# Patient Record
Sex: Male | Born: 1965 | Race: White | Hispanic: No | Smoking: Current every day smoker
Health system: Southern US, Community
[De-identification: ages and names within clinical notes are randomized; demographics above are authoritative.]

## PROBLEM LIST (undated history)

## (undated) DIAGNOSIS — M51369 Other intervertebral disc degeneration, lumbar region without mention of lumbar back pain or lower extremity pain: Secondary | ICD-10-CM

## (undated) DIAGNOSIS — K219 Gastro-esophageal reflux disease without esophagitis: Secondary | ICD-10-CM

## (undated) DIAGNOSIS — K649 Unspecified hemorrhoids: Secondary | ICD-10-CM

## (undated) DIAGNOSIS — I10 Essential (primary) hypertension: Secondary | ICD-10-CM

## (undated) DIAGNOSIS — I519 Heart disease, unspecified: Secondary | ICD-10-CM

## (undated) DIAGNOSIS — E785 Hyperlipidemia, unspecified: Secondary | ICD-10-CM

## (undated) DIAGNOSIS — N433 Hydrocele, unspecified: Secondary | ICD-10-CM

## (undated) DIAGNOSIS — M5136 Other intervertebral disc degeneration, lumbar region: Secondary | ICD-10-CM

## (undated) DIAGNOSIS — M1990 Unspecified osteoarthritis, unspecified site: Secondary | ICD-10-CM

## (undated) DIAGNOSIS — E119 Type 2 diabetes mellitus without complications: Secondary | ICD-10-CM

## (undated) DIAGNOSIS — G4730 Sleep apnea, unspecified: Secondary | ICD-10-CM

## (undated) DIAGNOSIS — R12 Heartburn: Secondary | ICD-10-CM

## (undated) HISTORY — DX: Unspecified hemorrhoids: K64.9

## (undated) HISTORY — DX: Type 2 diabetes mellitus without complications: E11.9

## (undated) HISTORY — DX: Sleep apnea, unspecified: G47.30

## (undated) HISTORY — DX: Heart disease, unspecified: I51.9

## (undated) HISTORY — DX: Hydrocele, unspecified: N43.3

## (undated) HISTORY — DX: Other intervertebral disc degeneration, lumbar region: M51.36

## (undated) HISTORY — DX: Heartburn: R12

## (undated) HISTORY — DX: Other intervertebral disc degeneration, lumbar region without mention of lumbar back pain or lower extremity pain: M51.369

## (undated) HISTORY — DX: Unspecified osteoarthritis, unspecified site: M19.90

## (undated) HISTORY — DX: Essential (primary) hypertension: I10

## (undated) HISTORY — DX: Gastro-esophageal reflux disease without esophagitis: K21.9

## (undated) HISTORY — DX: Hyperlipidemia, unspecified: E78.5

---

## 1990-03-27 HISTORY — PX: COLONOSCOPY: SHX174

## 1991-03-28 HISTORY — PX: POLYPECTOMY: SHX149

## 2002-03-27 HISTORY — PX: ROTATOR CUFF REPAIR: SHX139

## 2003-03-28 HISTORY — PX: CORONARY ANGIOPLASTY WITH STENT PLACEMENT: SHX49

## 2004-03-27 HISTORY — PX: CARPAL TUNNEL RELEASE: SHX101

## 2012-08-27 DIAGNOSIS — R809 Proteinuria, unspecified: Secondary | ICD-10-CM | POA: Insufficient documentation

## 2012-09-18 DIAGNOSIS — Z79899 Other long term (current) drug therapy: Secondary | ICD-10-CM | POA: Insufficient documentation

## 2015-06-01 ENCOUNTER — Encounter: Payer: Self-pay | Admitting: Urology

## 2015-06-01 ENCOUNTER — Ambulatory Visit (INDEPENDENT_AMBULATORY_CARE_PROVIDER_SITE_OTHER): Admitting: Urology

## 2015-06-01 VITALS — BP 164/94 | HR 79 | Ht 72.0 in | Wt 344.2 lb

## 2015-06-01 DIAGNOSIS — N432 Other hydrocele: Secondary | ICD-10-CM

## 2015-06-01 NOTE — Progress Notes (Signed)
06/01/2015 2:27 PM   Earl Middleton. [birth year] [id number]  Referring provider: No referring provider defined for this encounter.  Chief Complaint  Patient presents with  . Hydrocele    referred by Dr. Melina Copa     HPI:  50 year old male presents today for evaluation and management of a hydrocele. The patient first noted swelling in his scrotum following a racquetball incident while in the [employer] several years ago. He denies any significant pain currently but does note that over the last 5 years his scrotum has gotten progressively larger. He was seen at [facility] 2 years ago and recommended the patient have surgery. Since that time the patient has noted that the scrotum is gotten even progressively larger. He only has pain and discomfort when the scrotum was manipulated/sat down. He also has had difficulty with intercourse given the large scrotum  Interference.   The patient has chronic pain takes a significant amount of pain medication for this. He is also an insulin-dependent diabetic. His blood glucose levels have recently improved.     PMH: Past Medical History  Diagnosis Date  . Heartburn   . Hydrocele   . Diabetes ([place])   . Heart disease   . HLD (hyperlipidemia)   . HTN (hypertension)   . Sleep apnea     Surgical History: Past Surgical History  Procedure Laterality Date  . Coronary angioplasty with stent placement  2005  . Carpal tunnel release  2006    Home Medications:    Medication List       This list is accurate as of: 06/01/15  2:27 PM.  Always use your most recent med list.               amoxicillin 500 MG capsule  Commonly known as:  AMOXIL  Reported on 06/01/2015     aspirin 81 MG chewable tablet  Chew 81 mg by mouth.     atenolol 100 MG tablet  Commonly known as:  TENORMIN  Take 100 mg by mouth. Reported on 06/01/2015     atorvastatin 80 MG tablet  Commonly known as:  LIPITOR  Take 80 mg by mouth.     Azilsartan-Chlorthalidone 40-25  MG Tabs  Take by mouth. Reported on 06/01/2015     buPROPion 75 MG tablet  Commonly known as:  WELLBUTRIN  Take 75 mg by mouth.     BUTRANS 10 MCG/HR Ptwk patch  Generic drug:  buprenorphine  Place onto the skin.     BYSTOLIC 20 MG Tabs  Generic drug:  Nebivolol HCl  Take 20 mg by mouth.     clopidogrel 75 MG tablet  Commonly known as:  PLAVIX  Take 75 mg by mouth.     escitalopram 20 MG tablet  Commonly known as:  LEXAPRO  Take 20 mg by mouth.     fenofibrate 160 MG tablet  Take 160 mg by mouth. Reported on 06/01/2015     furosemide 20 MG tablet  Commonly known as:  LASIX  Take 20 mg by mouth.     glipiZIDE 5 MG 24 hr tablet  Commonly known as:  GLUCOTROL XL  Take 5 mg by mouth. Reported on 06/01/2015     hydrALAZINE 50 MG tablet  Commonly known as:  APRESOLINE  Take 50 mg by mouth.     ibuprofen 800 MG tablet  Commonly known as:  ADVIL,MOTRIN  Reported on 06/01/2015     insulin glargine 100 UNIT/ML injection  Commonly known  as:  LANTUS  Inject into the skin at bedtime.     lidocaine 5 % ointment  Commonly known as:  XYLOCAINE  Reported on 06/01/2015     LYRICA 100 MG capsule  Generic drug:  pregabalin  Take 100 mg by mouth.     morphine 15 MG 12 hr tablet  Commonly known as:  MS CONTIN  Take 15 mg by mouth.     MULTI-VITAMINS Tabs  Take by mouth.     NIFEdipine 90 MG 24 hr tablet  Commonly known as:  ADALAT CC  Take 90 mg by mouth.     Omega-3 1000 MG Caps  Take by mouth.     omeprazole 20 MG capsule  Commonly known as:  PRILOSEC  Take 20 mg by mouth. Reported on 06/01/2015     potassium chloride 20 MEQ packet  Commonly known as:  KLOR-CON  Take by mouth.     ROBAXIN-750 750 MG tablet  Generic drug:  methocarbamol  Take 750 mg by mouth.     spironolactone 25 MG tablet  Commonly known as:  ALDACTONE     TRADJENTA 5 MG Tabs tablet  Generic drug:  linagliptin  Take 5 mg by mouth.     valsartan 320 MG tablet  Commonly known as:  DIOVAN  Take  320 mg by mouth.     VICTOZA 18 MG/3ML Sopn  Generic drug:  Liraglutide  Inject 1.8 mg into the skin.     vitamin B-12 500 MCG tablet  Commonly known as:  CYANOCOBALAMIN  Take by mouth. Reported on 06/01/2015     Vitamin D (Ergocalciferol) 50000 units Caps capsule  Commonly known as:  DRISDOL  Take by mouth.        Allergies: No Known Allergies  Family History: Family History  Problem Relation Age of Onset  . Kidney disease Father   . Prostate cancer Neg Hx     Social History:  reports that he has been smoking.  He does not have any smokeless tobacco history on file. He reports that he drinks alcohol. He reports that he does not use illicit drugs.  ROS: UROLOGY Frequent Urination?: Yes Hard to postpone urination?: No Burning/pain with urination?: No Get up at night to urinate?: Yes Leakage of urine?: No Urine stream starts and stops?: No Trouble starting stream?: No Do you have to strain to urinate?: No Blood in urine?: No Urinary tract infection?: No Sexually transmitted disease?: No Injury to kidneys or bladder?: No Painful intercourse?: No Weak stream?: No Erection problems?: No Penile pain?: No  Gastrointestinal Nausea?: No Vomiting?: No Indigestion/heartburn?: No Diarrhea?: No Constipation?: No  Constitutional Fever: No Night sweats?: No Weight loss?: No Fatigue?: Yes  Skin Skin rash/lesions?: No Itching?: No  Eyes Blurred vision?: No Double vision?: No  Ears/Nose/Throat Sore throat?: No Sinus problems?: No  Hematologic/Lymphatic Swollen glands?: No Easy bruising?: No  Cardiovascular Leg swelling?: Yes Chest pain?: No  Respiratory Cough?: No Shortness of breath?: No  Endocrine Excessive thirst?: Yes  Musculoskeletal Back pain?: Yes Joint pain?: Yes  Neurological Headaches?: Yes Dizziness?: No  Psychologic Depression?: No Anxiety?: No  Physical Exam: BP 164/94 mmHg  Pulse 79  Ht 6' (1.829 m)  Wt 344 lb 2.6 oz  (156.11 kg)  BMI 46.67 kg/m2  Constitutional:  Alert and oriented, No acute distress.   Morbidly obese, walking with a cane. HEENT: [place] AT, moist mucus membranes.  Trachea midline, no masses. Cardiovascular: No clubbing, cyanosis, or edema. Respiratory: Normal  respiratory effort, no increased work of breathing. GI: Abdomen is soft, nontender, nondistended, no abdominal masses GU: No CVA tenderness.   Bilateral hydroceles. The left one appears to be complex in nature. Skin: No rashes, bruises or suspicious lesions. Lymph: No cervical or inguinal adenopathy. Neurologic: Grossly intact, no focal deficits, moving all 4 extremities. Psychiatric: Normal mood and affect.  Laboratory Data: No results found for: WBC, HGB, HCT, MCV, PLT  No results found for: CREATININE  No results found for: PSA  No results found for: TESTOSTERONE  No results found for: HGBA1C  Urinalysis No results found for: COLORURINE, APPEARANCEUR, LABSPEC, [place], GLUCOSEU, HGBUR, BILIRUBINUR, KETONESUR, PROTEINUR, UROBILINOGEN, NITRITE, LEUKOCYTESUR  Pertinent Imaging:  none available  Assessment & Plan:   The patient has what appears to be bilateral hydroceles.  1. Other hydrocele  I recommended that we [place] with scrotal ultrasound to give [place] a better idea of the complexity of his hydroceles and this 1 or both of these may be spermatoceles. Be also good to rule out  A concomitant hernia. The patient will we'll obtain a renal ultrasound and then follow-up here in [place] within the next 2 weeks. - US Scrotum; Future   Return in about 2 weeks (around 06/15/2015).  Ardis Hughs, [facility] [address], [place], [place] [address] [phone]

## 2015-06-08 ENCOUNTER — Ambulatory Visit
Admission: RE | Admit: 2015-06-08 | Discharge: 2015-06-08 | Disposition: A | Discharge status: Home / Self Care | Source: Ambulatory Visit | Attending: Urology | Admitting: Urology

## 2015-06-08 DIAGNOSIS — N432 Other hydrocele: Secondary | ICD-10-CM

## 2015-06-08 DIAGNOSIS — N433 Hydrocele, unspecified: Secondary | ICD-10-CM | POA: Insufficient documentation

## 2015-06-10 ENCOUNTER — Ambulatory Visit: Payer: Self-pay

## 2015-06-15 ENCOUNTER — Encounter: Payer: Self-pay | Admitting: Urology

## 2015-06-15 ENCOUNTER — Ambulatory Visit (INDEPENDENT_AMBULATORY_CARE_PROVIDER_SITE_OTHER): Admitting: Urology

## 2015-06-15 VITALS — BP 160/91 | HR 78 | Ht 72.0 in | Wt 349.0 lb

## 2015-06-15 DIAGNOSIS — K4090 Unilateral inguinal hernia, without obstruction or gangrene, not specified as recurrent: Secondary | ICD-10-CM | POA: Diagnosis not present

## 2015-06-15 NOTE — Progress Notes (Signed)
9:52 AM   Earl Middleton. 20-Dec-1965 [id number]  Referring provider: Marzetta Board, DO No address on file  Chief Complaint  Patient presents with  . Results    2wk    HPI: 50 year old male presents today for evaluation and management of a hydrocele. The patient first noted swelling in his scrotum following a racquetball incident while in the [employer] several years ago. He denies any significant pain currently but does note that over the last 5 years his scrotum has gotten progressively larger. He was seen at [facility] 2 years ago and recommended the patient have surgery. Since that time the patient has noted that the scrotum is gotten even progressively larger. He only has pain and discomfort when the scrotum was manipulated/sat down. He also has had difficulty with intercourse given the large scrotum  Interference.  The patient has chronic pain takes a significant amount of pain medication for this. He is also an insulin-dependent diabetic. His blood glucose levels have recently improved.  Interval: The patient follows up today to review his scrotal ultrasound. The patient states that over the past several days he has noted worsening swelling of his scrotum. The midline aspect of his scrotum is "cold". He denies any worsening voiding symptoms. He denies any abdominal or GI symptoms. He has not had any nausea/vomiting.  PMH: Past Medical History  Diagnosis Date  . Heartburn   . Hydrocele   . Diabetes ([place])   . Heart disease   . HLD (hyperlipidemia)   . HTN (hypertension)   . Sleep apnea     Surgical History: Past Surgical History  Procedure Laterality Date  . Coronary angioplasty with stent placement  2005  . Carpal tunnel release  2006    Home Medications:    Medication List       This list is accurate as of: 06/15/15  9:52 AM.  Always use your most recent med list.               aspirin 81 MG chewable tablet  Chew 81 mg by mouth.     atenolol 100 MG  tablet  Commonly known as:  TENORMIN  Take 100 mg by mouth. Reported on 06/01/2015     atorvastatin 80 MG tablet  Commonly known as:  LIPITOR  Take 80 mg by mouth.     Azilsartan-Chlorthalidone 40-25 MG Tabs  Take by mouth. Reported on 06/01/2015     buPROPion 75 MG tablet  Commonly known as:  WELLBUTRIN  Take 75 mg by mouth.     BUTRANS 10 MCG/HR Ptwk patch  Generic drug:  buprenorphine  Place onto the skin.     BYSTOLIC 20 MG Tabs  Generic drug:  Nebivolol HCl  Take 20 mg by mouth.     clopidogrel 75 MG tablet  Commonly known as:  PLAVIX  Take 75 mg by mouth.     escitalopram 20 MG tablet  Commonly known as:  LEXAPRO  Take 20 mg by mouth.     fenofibrate 160 MG tablet  Take 160 mg by mouth. Reported on 06/01/2015     furosemide 20 MG tablet  Commonly known as:  LASIX  Take 20 mg by mouth.     glipiZIDE 5 MG 24 hr tablet  Commonly known as:  GLUCOTROL XL  Take 5 mg by mouth. Reported on 06/01/2015     hydrALAZINE 50 MG tablet  Commonly known as:  APRESOLINE  Take 50 mg by mouth.  ibuprofen 800 MG tablet  Commonly known as:  ADVIL,MOTRIN  Reported on 06/01/2015     insulin glargine 100 UNIT/ML injection  Commonly known as:  LANTUS  Inject into the skin at bedtime.     lidocaine 5 % ointment  Commonly known as:  XYLOCAINE  Reported on 06/01/2015     LYRICA 100 MG capsule  Generic drug:  pregabalin  Take 100 mg by mouth.     morphine 15 MG 12 hr tablet  Commonly known as:  MS CONTIN  Take 15 mg by mouth. Reported on 06/15/2015     MULTI-VITAMINS Tabs  Take by mouth.     NIFEdipine 90 MG 24 hr tablet  Commonly known as:  ADALAT CC  Take 90 mg by mouth.     Omega-3 1000 MG Caps  Take by mouth.     omeprazole 20 MG capsule  Commonly known as:  PRILOSEC  Take 20 mg by mouth. Reported on 06/01/2015     potassium chloride 20 MEQ packet  Commonly known as:  KLOR-CON  Take by mouth.     ROBAXIN-750 750 MG tablet  Generic drug:  methocarbamol  Take 750  mg by mouth.     spironolactone 25 MG tablet  Commonly known as:  ALDACTONE     TRADJENTA 5 MG Tabs tablet  Generic drug:  linagliptin  Take 5 mg by mouth.     valsartan 320 MG tablet  Commonly known as:  DIOVAN  Take 320 mg by mouth.     VICTOZA 18 MG/3ML Sopn  Generic drug:  Liraglutide  Inject 1.8 mg into the skin.     vitamin B-12 500 MCG tablet  Commonly known as:  CYANOCOBALAMIN  Take by mouth. Reported on 06/01/2015     Vitamin D (Ergocalciferol) 50000 units Caps capsule  Commonly known as:  DRISDOL  Take by mouth.        Allergies: No Known Allergies  Family History: Family History  Problem Relation Age of Onset  . Kidney disease Father   . Prostate cancer Neg Hx     Social History:  reports that he has been smoking.  He does not have any smokeless tobacco history on file. He reports that he drinks alcohol. He reports that he does not use illicit drugs.  ROS: UROLOGY Frequent Urination?: No Hard to postpone urination?: No Burning/pain with urination?: No Get up at night to urinate?: No Leakage of urine?: No Urine stream starts and stops?: No Trouble starting stream?: No Do you have to strain to urinate?: No Blood in urine?: No Urinary tract infection?: No Sexually transmitted disease?: No Injury to kidneys or bladder?: No Painful intercourse?: No Weak stream?: No Erection problems?: No Penile pain?: No  Gastrointestinal Nausea?: No Vomiting?: No Indigestion/heartburn?: No Diarrhea?: No Constipation?: No  Constitutional Fever: No Night sweats?: No Weight loss?: No Fatigue?: No  Skin Skin rash/lesions?: No Itching?: Yes  Eyes Blurred vision?: No Double vision?: No  Ears/Nose/Throat Sore throat?: No Sinus problems?: No  Hematologic/Lymphatic Swollen glands?: No Easy bruising?: No  Cardiovascular Leg swelling?: Yes Chest pain?: No  Respiratory Cough?: No Shortness of breath?: No  Endocrine Excessive thirst?:  No  Musculoskeletal Back pain?: No Joint pain?: No  Neurological Headaches?: No Dizziness?: No  Psychologic Depression?: No Anxiety?: No  Physical Exam: BP 160/91 mmHg  Pulse 78  Ht 6' (1.829 m)  Wt 158.305 kg (349 lb)  BMI 47.32 kg/m2  Constitutional:  Alert and oriented, No acute distress.  Morbidly obese, walking with a cane. The patient has significant scrotal edema which when elevated reduces the size of the scrotum significantly. I'm unable to appreciate a inguinal hernia on exam given the thickened inguinal region secondary to fat. The left hydrocele does appear to be more complex.  Laboratory Data: No results found for: WBC, HGB, HCT, MCV, PLT  No results found for: CREATININE  No results found for: PSA  No results found for: TESTOSTERONE  No results found for: HGBA1C  Urinalysis No results found for: COLORURINE, APPEARANCEUR, LABSPEC, [place], GLUCOSEU, HGBUR, BILIRUBINUR, KETONESUR, PROTEINUR, UROBILINOGEN, NITRITE, LEUKOCYTESUR  Pertinent Imaging:  The patient underwent a trans-scrotal ultrasound which demonstrates bilateral hydroceles, left greater than right. There is question of a inguinal hernia on the left but is poorly characterized on the ultrasound.  Assessment & Plan:   The patient has what appears to be bilateral hydroceles. He likely also has a left inguinal hernia.  However the ultrasound findings with the patient detail. We discussed the nature of his bilateral hydroceles, the diffuse swelling of his scrotum and the question of left inguinal hernia. The patient would like his hydroceles repaired surgically. Simultaneously you would prefer to have oozing or hernia fixed if there is a defect there. I described the surgery for the patient in detail including the risks and benefits of bilateral hydrocelectomy. The patient understands that there is a small risk of recurrence, bleeding, infection, and loss of testicle. These are all very small risk. He  understands the expected recovery time is approximately 2 weeks for his incisional discomfort and 6 weeks for the swelling.    I'm hopeful that we'll be able to get this scheduled simultaneously with his inguinal hernia repair. I ordered a CT abdomen/pelvis with oral contrast to better delineate the hernia. I have then referred the patient to Dr. Ralene Ok, M.D. [place] surgery for consultation. I will try to coordinate with Dr. Rosendo Gros the weekend to both procedures at the same time if indicated.  The patient scrotal edema secondary to his congestive heart failure. He also has bilateral lower extreme pitting edema. I recommended the patient discussed this with his primary care doctor and potentially increases Lasix. I also recommended scrotal support and elevation while lying down.  Cc: Dr. Ralene Ok, M.D., [facility] surgery CC: Dr. Posey Rea, D.O.   No Follow-up on file.  Ardis Hughs, [facility] [address], [place], [place] [address] [phone]

## 2015-06-17 ENCOUNTER — Ambulatory Visit
Admission: RE | Admit: 2015-06-17 | Discharge: 2015-06-17 | Disposition: A | Discharge status: Home / Self Care | Source: Ambulatory Visit | Attending: Urology | Admitting: Urology

## 2015-06-17 ENCOUNTER — Other Ambulatory Visit: Payer: Self-pay | Admitting: Urology

## 2015-06-17 DIAGNOSIS — K4090 Unilateral inguinal hernia, without obstruction or gangrene, not specified as recurrent: Secondary | ICD-10-CM | POA: Diagnosis present

## 2015-06-17 DIAGNOSIS — K4020 Bilateral inguinal hernia, without obstruction or gangrene, not specified as recurrent: Secondary | ICD-10-CM | POA: Diagnosis not present

## 2015-06-17 LAB — POCT I-STAT CREATININE: Creatinine, Ser: 2.2 mg/dL — ABNORMAL HIGH (ref 0.61–1.24)

## 2015-06-18 ENCOUNTER — Telehealth: Payer: Self-pay

## 2015-06-18 ENCOUNTER — Encounter: Payer: Self-pay | Admitting: *Deleted

## 2015-06-18 NOTE — Telephone Encounter (Signed)
[facility] Radiology called stating CT results are in but wanted to draw attention to the hernia reference in the impression.

## 2015-06-22 ENCOUNTER — Encounter: Payer: Self-pay | Admitting: General Surgery

## 2015-06-22 ENCOUNTER — Ambulatory Visit (INDEPENDENT_AMBULATORY_CARE_PROVIDER_SITE_OTHER): Admitting: General Surgery

## 2015-06-22 VITALS — BP 144/78 | HR 88 | Resp 16 | Ht 72.0 in | Wt 343.0 lb

## 2015-06-22 DIAGNOSIS — K4020 Bilateral inguinal hernia, without obstruction or gangrene, not specified as recurrent: Secondary | ICD-10-CM | POA: Diagnosis not present

## 2015-06-22 NOTE — Patient Instructions (Signed)
The patient is aware to call back for any questions or concerns.  

## 2015-06-22 NOTE — Progress Notes (Signed)
Patient ID: Earl Bath., male   DOB: [birth year], 50 y.o.   MRN: [id number]  Chief Complaint  Patient presents with  . Other    left inguinal hernia    HPI Earl Middleton. is a 50 y.o. male here today for a evaluationof a left inguinal hernia. Patient had an ultrasound done on 06/08/15 and CT scan done on 06/17/15. He states he noticed the swelling in the testicle around [place] while in [employer]. He states it has gotten larger about 4-5 months ago. He states he needs surgery for his hydrocele by Dr Louis Meckel and wants the hernia fixed as well.    HPI  Past Medical History  Diagnosis Date  . Heartburn   . Hydrocele   . Heart disease   . HLD (hyperlipidemia)   . HTN (hypertension)   . Sleep apnea   . Diabetes ([place])     type 2  . GERD (gastroesophageal reflux disease)   . Degenerative disc disease, lumbar   . Hemorrhoid   . Arthritis     Past Surgical History  Procedure Laterality Date  . Coronary angioplasty with stent placement  2005  . Carpal tunnel release  2006  . Rotator cuff repair  2004  . Polypectomy  1993    throat  . Colonoscopy  1992    Family History  Problem Relation Age of Onset  . Kidney disease Father   . Prostate cancer Neg Hx     Social History Social History  Substance Use Topics  . Smoking status: Current Every Day Smoker -- 0.50 packs/day for 1 years  . Smokeless tobacco: Never Used  . Alcohol Use: 0.0 oz/week    0 Standard drinks or equivalent per week    No Known Allergies  Current Outpatient Prescriptions  Medication Sig Dispense Refill  . aspirin 81 MG chewable tablet Chew 81 mg by mouth.    Marland Kitchen atenolol (TENORMIN) 100 MG tablet Take 100 mg by mouth. Reported on 06/01/2015    . atorvastatin (LIPITOR) 80 MG tablet Take 80 mg by mouth.    . Azilsartan-Chlorthalidone 40-25 MG TABS Take by mouth. Reported on 06/01/2015    . buprenorphine (BUTRANS) 10 MCG/HR PTWK patch Place onto the skin.    Marland Kitchen buPROPion (WELLBUTRIN) 75 MG tablet Take 75 mg  by mouth.    . clopidogrel (PLAVIX) 75 MG tablet Take 75 mg by mouth.    . escitalopram (LEXAPRO) 20 MG tablet Take 20 mg by mouth.    . fenofibrate 160 MG tablet Take 160 mg by mouth. Reported on 06/01/2015    . furosemide (LASIX) 20 MG tablet Take 20 mg by mouth.    Marland Kitchen glipiZIDE (GLUCOTROL XL) 5 MG 24 hr tablet Take 5 mg by mouth. Reported on 06/01/2015    . hydrALAZINE (APRESOLINE) 50 MG tablet Take 50 mg by mouth.    Marland Kitchen ibuprofen (ADVIL,MOTRIN) 800 MG tablet Reported on 06/01/2015  1  . linagliptin (TRADJENTA) 5 MG TABS tablet Take 5 mg by mouth.    . Liraglutide (VICTOZA) 18 MG/3ML SOPN Inject 1.8 mg into the skin.    . methocarbamol (ROBAXIN-750) 750 MG tablet Take 750 mg by mouth.    . morphine (MS CONTIN) 15 MG 12 hr tablet Take 15 mg by mouth as needed. Reported on 06/15/2015    . Multiple Vitamin (MULTI-VITAMINS) TABS Take by mouth.    . Nebivolol HCl (BYSTOLIC) 20 MG TABS Take 20 mg by mouth.    Marland Kitchen  NIFEdipine (ADALAT CC) 90 MG 24 hr tablet Take 90 mg by mouth.    . Omega-3 1000 MG CAPS Take by mouth.    Marland Kitchen omeprazole (PRILOSEC) 20 MG capsule Take 20 mg by mouth. Reported on 06/01/2015    . potassium chloride (KLOR-CON) 20 MEQ packet Take by mouth.    . pregabalin (LYRICA) 100 MG capsule Take 100 mg by mouth.    . spironolactone (ALDACTONE) 25 MG tablet   0  . valsartan (DIOVAN) 320 MG tablet Take 320 mg by mouth.    . vitamin B-12 (CYANOCOBALAMIN) 500 MCG tablet Take by mouth. Reported on 06/01/2015    . Vitamin D, Ergocalciferol, (DRISDOL) 50000 units CAPS capsule Take by mouth.    . insulin glargine (LANTUS) 100 UNIT/ML injection Inject into the skin at bedtime. Reported on 06/22/2015     No current facility-administered medications for this visit.    Review of Systems Review of Systems  Constitutional: Negative.   Respiratory: Negative.   Cardiovascular: Negative.     Blood pressure 144/78, pulse 88, resp. rate 16, height 6' (1.829 m), weight 343 lb (155.584 kg).  Physical  Exam Physical Exam  Constitutional: He is oriented to person, place, and time. He appears well-developed and well-nourished.  HENT:  Mouth/Throat: Oropharynx is clear and moist.  Eyes: Conjunctivae are normal. No scleral icterus.  Neck: Neck supple.  Cardiovascular: Normal rate, regular rhythm and normal heart sounds.   Pulmonary/Chest: Effort normal and breath sounds normal.  Abdominal: Soft. There is no tenderness.  Moderate edema of the lower half of the abdomen extending to the scrotal area and both lower extremity, hard to tell if infact he has a hernia because of the edema. He does have large hydroceles   Lymphadenopathy:    He has no cervical adenopathy.  Neurological: He is alert and oriented to person, place, and time.  Skin: Skin is warm and dry.  Psychiatric: His behavior is normal.    Data Reviewed CT scan and progress notes.  Assessment    Bilateral inguinal hernia.Large hydroceles.    Plan    Hernia precautions and incarceration were discussed with the patient. If they develop symptoms of an incarcerated hernia, they were encouraged to seek prompt medical attention.  Pt advised that if he is to have surgery for the hydrocele, hernia can be repaired at that time.  Given the problem of edema, surgery is likely to be more involved. Risk of infection also is higher. Mesh repair is still best but this can be decided at time of surgery.       CP:  Marzetta Board Ref Dr Louis Meckel  This information has been scribed by Gaspar Cola CMA.  Zerina Hallinan G 06/30/2015, 8:25 AM

## 2015-06-28 ENCOUNTER — Ambulatory Visit: Payer: Self-pay | Admitting: General Surgery

## 2015-06-30 ENCOUNTER — Encounter: Payer: Self-pay | Admitting: General Surgery

## 2015-07-01 ENCOUNTER — Encounter: Payer: Self-pay | Admitting: Urology

## 2015-07-01 ENCOUNTER — Ambulatory Visit (INDEPENDENT_AMBULATORY_CARE_PROVIDER_SITE_OTHER): Admitting: Urology

## 2015-07-01 VITALS — BP 144/74 | HR 85 | Ht 72.0 in | Wt 346.0 lb

## 2015-07-01 DIAGNOSIS — N5089 Other specified disorders of the male genital organs: Secondary | ICD-10-CM

## 2015-07-01 DIAGNOSIS — N433 Hydrocele, unspecified: Secondary | ICD-10-CM | POA: Diagnosis not present

## 2015-07-01 DIAGNOSIS — R600 Localized edema: Secondary | ICD-10-CM

## 2015-07-01 NOTE — Progress Notes (Signed)
07/01/2015 10:06 AM   Earl Middleton. Jan 23, 1966 [id number]  Referring provider: Marzetta Board, DO No address on file  Chief Complaint  Patient presents with  . Pre-op Exam  . Hydrocele    HPI: 50 year old male who returns today to discuss surgical intervention for his hydrocele/hernias.  He was seen by Dr. Jamal Collin last week for evaluation of his left inguinal hernia for consideration of combined procedure. At that time, was brought to my attention that the patient has developed worsening scrotal edema along with 2+ pitting bilateral lower extremity edema. As such, the patient was asked to return today for reevaluation in the office.  Patient reports that over the past month, he is developed dramatic worsening of the size of his scrotum. Prior to this, it had been relatively stable. Over the past 3 weeks, is also developed severe worsening swelling in his lower extremities. He reports that he did have some trace swelling in the past and is on a small dose/20 mg of Lasix due to this.  He denies any significant scrotal pain although has fullness, heaviness, and discomfort in his scrotum related to positioning. He also has difficulty voiding due to the size of his scrotum and resulting buried penis.  Patient does have a cardiac history and underwent remote history of placement of cardiac stent. He states his last cardiac echo was approximately 5 years ago. He denies any shortness of breath but does have baseline dyspnea on exertion.  His primary care physician is Dr. Melina Copa in [facility].   PMH: Past Medical History  Diagnosis Date  . Heartburn   . Hydrocele   . Heart disease   . HLD (hyperlipidemia)   . HTN (hypertension)   . Sleep apnea   . Diabetes ([place])     type 2  . GERD (gastroesophageal reflux disease)   . Degenerative disc disease, lumbar   . Hemorrhoid   . Arthritis     Surgical History: Past Surgical History  Procedure Laterality Date  . Coronary angioplasty  with stent placement  2005  . Carpal tunnel release  2006  . Rotator cuff repair  2004  . Polypectomy  1993    throat  . Colonoscopy  1992    Home Medications:    Medication List       This list is accurate as of: 07/01/15 10:06 AM.  Always use your most recent med list.               aspirin 81 MG chewable tablet  Chew 81 mg by mouth.     atenolol 100 MG tablet  Commonly known as:  TENORMIN  Take 100 mg by mouth. Reported on 06/01/2015     atorvastatin 80 MG tablet  Commonly known as:  LIPITOR  Take 80 mg by mouth.     Azilsartan-Chlorthalidone 40-25 MG Tabs  Take by mouth. Reported on 06/01/2015     buPROPion 75 MG tablet  Commonly known as:  WELLBUTRIN  Take 75 mg by mouth.     BUTRANS 10 MCG/HR [place] patch  Generic drug:  buprenorphine  Place onto the skin.     BYSTOLIC 20 MG Tabs  Generic drug:  Nebivolol HCl  Take 20 mg by mouth.     clopidogrel 75 MG tablet  Commonly known as:  PLAVIX  Take 75 mg by mouth.     escitalopram 20 MG tablet  Commonly known as:  LEXAPRO  Take 20 mg by mouth.  fenofibrate 160 MG tablet  Take 160 mg by mouth. Reported on 06/01/2015     furosemide 20 MG tablet  Commonly known as:  LASIX  Take 20 mg by mouth.     glipiZIDE 5 MG 24 hr tablet  Commonly known as:  GLUCOTROL XL  Take 5 mg by mouth. Reported on 06/01/2015     hydrALAZINE 50 MG tablet  Commonly known as:  APRESOLINE  Take 50 mg by mouth.     ibuprofen 800 MG tablet  Commonly known as:  ADVIL,MOTRIN  Reported on 06/01/2015     insulin glargine 100 UNIT/ML injection  Commonly known as:  LANTUS  Inject into the skin at bedtime. Reported on 06/22/2015     LYRICA 100 MG capsule  Generic drug:  pregabalin  Take 100 mg by mouth.     morphine 15 MG 12 hr tablet  Commonly known as:  MS CONTIN  Take 15 mg by mouth as needed. Reported on 06/15/2015     MULTI-VITAMINS Tabs  Take by mouth.     NIFEdipine 90 MG 24 hr tablet  Commonly known as:  ADALAT CC  Take 90  mg by mouth.     Omega-3 1000 MG Caps  Take by mouth.     omeprazole 20 MG capsule  Commonly known as:  PRILOSEC  Take 20 mg by mouth. Reported on 06/01/2015     potassium chloride 20 MEQ packet  Commonly known as:  KLOR-CON  Take by mouth.     ROBAXIN-750 750 MG tablet  Generic drug:  methocarbamol  Take 750 mg by mouth.     spironolactone 25 MG tablet  Commonly known as:  ALDACTONE     TRADJENTA 5 MG Tabs tablet  Generic drug:  linagliptin  Take 5 mg by mouth.     valsartan 320 MG tablet  Commonly known as:  DIOVAN  Take 320 mg by mouth.     VICTOZA 18 MG/3ML Sopn  Generic drug:  Liraglutide  Inject 1.8 mg into the skin.     vitamin B-12 500 MCG tablet  Commonly known as:  CYANOCOBALAMIN  Take by mouth. Reported on 06/01/2015     Vitamin D (Ergocalciferol) 50000 units Caps capsule  Commonly known as:  DRISDOL  Take by mouth.        Allergies: No Known Allergies  Family History: Family History  Problem Relation Age of Onset  . Kidney disease Father   . Prostate cancer Neg Hx     Social History:  reports that he has been smoking.  He has never used smokeless tobacco. He reports that he drinks alcohol. He reports that he does not use illicit drugs.  ROS: UROLOGY Frequent Urination?: No Hard to postpone urination?: No Burning/pain with urination?: No Get up at night to urinate?: No Leakage of urine?: No Urine stream starts and stops?: No Trouble starting stream?: No Do you have to strain to urinate?: No Blood in urine?: No Urinary tract infection?: No Sexually transmitted disease?: No Injury to kidneys or bladder?: No Painful intercourse?: No Weak stream?: No Erection problems?: No Penile pain?: No  Gastrointestinal Nausea?: No Vomiting?: No Indigestion/heartburn?: No Diarrhea?: No Constipation?: No  Constitutional Fever: No Night sweats?: No Weight loss?: No Fatigue?: No  Skin Skin rash/lesions?: No Itching?: Yes  Eyes Blurred  vision?: No Double vision?: No  Ears/Nose/Throat Sore throat?: No Sinus problems?: No  Hematologic/Lymphatic Swollen glands?: No Easy bruising?: Yes  Cardiovascular Leg swelling?: Yes Chest pain?: No  Respiratory Cough?: No Shortness of breath?: No  Endocrine Excessive thirst?: No  Musculoskeletal Back pain?: No Joint pain?: No  Neurological Headaches?: No Dizziness?: No  Psychologic Depression?: No Anxiety?: No  Physical Exam: BP 144/74 mmHg  Pulse 85  Ht 6' (1.829 m)  Wt 346 lb (156.945 kg)  BMI 46.92 kg/m2  Constitutional:  Alert and oriented, No acute distress. HEENT: [place] AT, moist mucus membranes.  Trachea midline, no masses. Cardiovascular: No clubbing, cyanosis.  Severe 2+ pitting edema up to the knees. Respiratory: Normal respiratory effort, no increased work of breathing. GI: Abdomen is soft, nontender, nondistended, no abdominal masses.  morbidly obese.  Suprapubic panus.   GU: Massively enlarged edematous scrotum, left hemiscrotum greater than right.  Scrotum is nearly basketball sized. Overlying skin changes consistent with severe edema/ pitting. Slight scrotal erythema on the lateral aspect without concern for infection.  Perineum intact. No crepitus. Skin: No rashes, bruises or suspicious lesions.. Neurologic: Grossly intact, no focal deficits, moving all 4 extremities. Psychiatric: Normal mood and affect.  Laboratory Data:  Lab Results  Component Value Date   CREATININE 2.20* 06/17/2015   Pertinent Imaging: Study Result     CLINICAL DATA: LEFT inguinal hernia.  EXAM: CT ABDOMEN AND PELVIS WITHOUT CONTRAST  TECHNIQUE: Multidetector CT imaging of the abdomen and pelvis was performed following the standard protocol without IV contrast.  COMPARISON: None.  FINDINGS: Lower chest: Lung bases are clear.  Hepatobiliary: No focal hepatic lesions noncontrast exam. Small gallstone.  Pancreas: Pancreas is normal. No ductal  dilatation. No pancreatic inflammation.  Spleen: Normal spleen  Adrenals/urinary tract: Adrenal glands are normal. No nephrolithiasis or ureterolithiasis. Normal bladder.  Stomach/Bowel: Stomach, small bowel, appendix, and cecum are normal. The colon and rectosigmoid colon are normal.  Vascular/Lymphatic: Abdominal aorta is normal caliber with atherosclerotic calcification. There is no retroperitoneal or periportal lymphadenopathy. No pelvic lymphadenopathy.  Reproductive: Exam extended through the scrotum. There is large volume of subcutaneous fluid within the LEFT and RIGHT hemiscrotum which Extends the base of the penis. There is a large LEFT inguinal hernia with herniated fat extending into LEFT hemiscrotum. The herniated fat expands the canal to 5.5 cm by 7.1 cm (image 112, series 12) There has similar findings on the RIGHT to a lesser degree with the herniated fat measuring approximately 4.7 by 2.5 cm in axial dimension.  Other: Mild subcutaneous edema in the tissues of the lower abdomen  Musculoskeletal: Degenerate changes the spine  IMPRESSION: 1. Large bilateral fat filled inguinal hernias, LEFT greater Than RIGHT. Herniated fat extends into the LEFT and RIGHT hemiscrotum. The herniated fat on the LEFT expands the canal to 6 cm. 2. Large volume subcutaneous fluid within the scrotum extended to the base of penis.   Electronically Signed  By: Suzy Bouchard M.D.  On: 06/17/2015 17:18    CT scan personally reviewed today.  Assessment & Plan:    1. Hydrocele, unspecified hydrocele type/ left inguinal hernia Patient does have bilateral hydroceles with left inguinal hernia and has been evaluated by Dr. Jamal Collin.  He may eventually need hydrocelectomy/hernia repair but I'm concerned given the progression of his scrotal edema and lower extremity edema as this seems to the more concerning and likely contributing more to his overall symptoms hydrocele/hernia  itself.  I would like to defer surgical intervention at this time and have him see his primary care physician for further evaluation of his severe scrotal edema and lower extremity edema. He may require further cardiac workup or diuresis and I  would prefer to optimize him prior to any surgical intervention.  I called and left a message for his PCP, Dr. Melina Copa, and a vascular to see him in the near future for further management. I have also discussed this case with Dr. Jamal Collin who agrees.  In the meantime, we discussed scrotal support today at length. I would like him to start wearing compressive underwear versus jockstrap and elevate his scrotum as possible.  2. Scrotal edema As above  3. Bilateral leg edema As above   Return in about 4 weeks (around 07/29/2015) for Dr. Erlene Quan.  Hollice Espy, MD  [facility] [address] [place], [place] [address] [phone]

## 2015-07-02 NOTE — Progress Notes (Signed)
Agree with decision to defer surgery.  As noted pt is high risk for complications from his severe edema from lower abdomen to his lower extremities

## 2015-07-30 ENCOUNTER — Ambulatory Visit: Admitting: Urology

## 2015-08-02 ENCOUNTER — Other Ambulatory Visit: Payer: Self-pay | Admitting: Family Medicine

## 2015-08-02 DIAGNOSIS — I10 Essential (primary) hypertension: Secondary | ICD-10-CM

## 2015-08-02 DIAGNOSIS — R609 Edema, unspecified: Secondary | ICD-10-CM

## 2015-08-06 ENCOUNTER — Ambulatory Visit

## 2015-09-09 ENCOUNTER — Encounter: Payer: Self-pay | Admitting: Urology

## 2015-09-09 ENCOUNTER — Ambulatory Visit: Admitting: Urology

## 2018-09-11 ENCOUNTER — Inpatient Hospital Stay (HOSPITAL_COMMUNITY)
Admission: EM | Admit: 2018-09-11 | Discharge: 2018-09-15 | DRG: 871 | Disposition: A | Discharge status: Home / Self Care | Payer: Medicare Other | Attending: Internal Medicine | Admitting: Internal Medicine

## 2018-09-11 ENCOUNTER — Emergency Department (HOSPITAL_COMMUNITY): Payer: Medicare Other

## 2018-09-11 ENCOUNTER — Encounter (HOSPITAL_COMMUNITY): Payer: Self-pay | Admitting: Emergency Medicine

## 2018-09-11 DIAGNOSIS — M1990 Unspecified osteoarthritis, unspecified site: Secondary | ICD-10-CM | POA: Diagnosis present

## 2018-09-11 DIAGNOSIS — J189 Pneumonia, unspecified organism: Secondary | ICD-10-CM | POA: Diagnosis present

## 2018-09-11 DIAGNOSIS — Z7902 Long term (current) use of antithrombotics/antiplatelets: Secondary | ICD-10-CM

## 2018-09-11 DIAGNOSIS — E1122 Type 2 diabetes mellitus with diabetic chronic kidney disease: Secondary | ICD-10-CM | POA: Diagnosis present

## 2018-09-11 DIAGNOSIS — Z955 Presence of coronary angioplasty implant and graft: Secondary | ICD-10-CM

## 2018-09-11 DIAGNOSIS — Z992 Dependence on renal dialysis: Secondary | ICD-10-CM

## 2018-09-11 DIAGNOSIS — R509 Fever, unspecified: Secondary | ICD-10-CM

## 2018-09-11 DIAGNOSIS — Z791 Long term (current) use of non-steroidal anti-inflammatories (NSAID): Secondary | ICD-10-CM

## 2018-09-11 DIAGNOSIS — A419 Sepsis, unspecified organism: Principal | ICD-10-CM | POA: Diagnosis present

## 2018-09-11 DIAGNOSIS — Z79899 Other long term (current) drug therapy: Secondary | ICD-10-CM

## 2018-09-11 DIAGNOSIS — Z6841 Body Mass Index (BMI) 40.0 and over, adult: Secondary | ICD-10-CM

## 2018-09-11 DIAGNOSIS — D631 Anemia in chronic kidney disease: Secondary | ICD-10-CM | POA: Diagnosis present

## 2018-09-11 DIAGNOSIS — F17210 Nicotine dependence, cigarettes, uncomplicated: Secondary | ICD-10-CM | POA: Diagnosis present

## 2018-09-11 DIAGNOSIS — E8889 Other specified metabolic disorders: Secondary | ICD-10-CM | POA: Diagnosis present

## 2018-09-11 DIAGNOSIS — D696 Thrombocytopenia, unspecified: Secondary | ICD-10-CM | POA: Diagnosis present

## 2018-09-11 DIAGNOSIS — R6510 Systemic inflammatory response syndrome (SIRS) of non-infectious origin without acute organ dysfunction: Secondary | ICD-10-CM | POA: Diagnosis present

## 2018-09-11 DIAGNOSIS — Z841 Family history of disorders of kidney and ureter: Secondary | ICD-10-CM

## 2018-09-11 DIAGNOSIS — I10 Essential (primary) hypertension: Secondary | ICD-10-CM | POA: Diagnosis present

## 2018-09-11 DIAGNOSIS — Z20828 Contact with and (suspected) exposure to other viral communicable diseases: Secondary | ICD-10-CM | POA: Diagnosis present

## 2018-09-11 DIAGNOSIS — I120 Hypertensive chronic kidney disease with stage 5 chronic kidney disease or end stage renal disease: Secondary | ICD-10-CM | POA: Diagnosis present

## 2018-09-11 DIAGNOSIS — Z7982 Long term (current) use of aspirin: Secondary | ICD-10-CM

## 2018-09-11 DIAGNOSIS — G43909 Migraine, unspecified, not intractable, without status migrainosus: Secondary | ICD-10-CM | POA: Diagnosis present

## 2018-09-11 DIAGNOSIS — K219 Gastro-esophageal reflux disease without esophagitis: Secondary | ICD-10-CM | POA: Diagnosis present

## 2018-09-11 DIAGNOSIS — E785 Hyperlipidemia, unspecified: Secondary | ICD-10-CM | POA: Diagnosis present

## 2018-09-11 DIAGNOSIS — G4733 Obstructive sleep apnea (adult) (pediatric): Secondary | ICD-10-CM | POA: Diagnosis present

## 2018-09-11 DIAGNOSIS — N186 End stage renal disease: Secondary | ICD-10-CM

## 2018-09-11 DIAGNOSIS — E1165 Type 2 diabetes mellitus with hyperglycemia: Secondary | ICD-10-CM

## 2018-09-11 DIAGNOSIS — N189 Chronic kidney disease, unspecified: Secondary | ICD-10-CM | POA: Diagnosis present

## 2018-09-11 DIAGNOSIS — Z794 Long term (current) use of insulin: Secondary | ICD-10-CM

## 2018-09-11 DIAGNOSIS — J440 Chronic obstructive pulmonary disease with acute lower respiratory infection: Secondary | ICD-10-CM | POA: Diagnosis present

## 2018-09-11 DIAGNOSIS — E1129 Type 2 diabetes mellitus with other diabetic kidney complication: Secondary | ICD-10-CM | POA: Diagnosis present

## 2018-09-11 DIAGNOSIS — I2510 Atherosclerotic heart disease of native coronary artery without angina pectoris: Secondary | ICD-10-CM | POA: Diagnosis present

## 2018-09-11 DIAGNOSIS — N2581 Secondary hyperparathyroidism of renal origin: Secondary | ICD-10-CM | POA: Diagnosis present

## 2018-09-11 DIAGNOSIS — E11649 Type 2 diabetes mellitus with hypoglycemia without coma: Secondary | ICD-10-CM | POA: Diagnosis present

## 2018-09-11 LAB — CBC WITH DIFFERENTIAL/PLATELET
Abs Immature Granulocytes: 0.04 10*3/uL (ref 0.00–0.07)
Basophils Absolute: 0 10*3/uL (ref 0.0–0.1)
Basophils Relative: 0 %
Eosinophils Absolute: 0 10*3/uL (ref 0.0–0.5)
Eosinophils Relative: 0 %
HCT: 30.9 % — ABNORMAL LOW (ref 39.0–52.0)
Hemoglobin: 9.9 g/dL — ABNORMAL LOW (ref 13.0–17.0)
Immature Granulocytes: 0 %
Lymphocytes Relative: 6 %
Lymphs Abs: 0.6 10*3/uL — ABNORMAL LOW (ref 0.7–4.0)
MCH: 34.5 pg — ABNORMAL HIGH (ref 26.0–34.0)
MCHC: 32 g/dL (ref 30.0–36.0)
MCV: 107.7 fL — ABNORMAL HIGH (ref 80.0–100.0)
Monocytes Absolute: 0.7 10*3/uL (ref 0.1–1.0)
Monocytes Relative: 7 %
Neutro Abs: 8.2 10*3/uL — ABNORMAL HIGH (ref 1.7–7.7)
Neutrophils Relative %: 87 %
Platelets: 144 10*3/uL — ABNORMAL LOW (ref 150–400)
RBC: 2.87 MIL/uL — ABNORMAL LOW (ref 4.22–5.81)
RDW: 15.1 % (ref 11.5–15.5)
WBC: 9.6 10*3/uL (ref 4.0–10.5)
nRBC: 0 % (ref 0.0–0.2)

## 2018-09-11 LAB — COMPREHENSIVE METABOLIC PANEL
ALT: 19 U/L (ref 0–44)
AST: 19 U/L (ref 15–41)
Albumin: 3 g/dL — ABNORMAL LOW (ref 3.5–5.0)
Alkaline Phosphatase: 26 U/L — ABNORMAL LOW (ref 38–126)
Anion gap: 15 (ref 5–15)
BUN: 53 mg/dL — ABNORMAL HIGH (ref 6–20)
CO2: 28 mmol/L (ref 22–32)
Calcium: 7.7 mg/dL — ABNORMAL LOW (ref 8.9–10.3)
Chloride: 98 mmol/L (ref 98–111)
Creatinine, Ser: 12.25 mg/dL — ABNORMAL HIGH (ref 0.61–1.24)
GFR calc Af Amer: 5 mL/min — ABNORMAL LOW (ref >60)
GFR calc non Af Amer: 4 mL/min — ABNORMAL LOW (ref >60)
Glucose, Bld: 99 mg/dL (ref 70–99)
Potassium: 4 mmol/L (ref 3.5–5.1)
Sodium: 141 mmol/L (ref 135–145)
Total Bilirubin: 0.8 mg/dL (ref 0.3–1.2)
Total Protein: 6.5 g/dL (ref 6.5–8.1)

## 2018-09-11 LAB — LACTIC ACID, PLASMA: Lactic Acid, Venous: 1.7 mmol/L (ref 0.5–1.9)

## 2018-09-11 LAB — PROTIME-INR
INR: 1.3 — ABNORMAL HIGH (ref 0.8–1.2)
Prothrombin Time: 16 seconds — ABNORMAL HIGH (ref 11.4–15.2)

## 2018-09-11 MED ORDER — ACETAMINOPHEN 325 MG PO TABS
650.0000 mg | ORAL_TABLET | Freq: Once | ORAL | Status: AC
  Administered 2018-09-12: 650 mg via ORAL
  Filled 2018-09-11: qty 2

## 2018-09-11 MED ORDER — SODIUM CHLORIDE 0.9% FLUSH: 3.0000 mL | Freq: Once | INTRAVENOUS | Status: DC

## 2018-09-11 NOTE — ED Notes (Signed)
Pt took Tylenol PTA.

## 2018-09-11 NOTE — ED Triage Notes (Signed)
Pt in POV reporting fever X2 days. Reports a cough but states this is no different from his normal "smokers cough" Dialysis pt, T,Th,S. Did receive full treatment yesterday.

## 2018-09-12 ENCOUNTER — Inpatient Hospital Stay (HOSPITAL_COMMUNITY): Payer: Medicare Other

## 2018-09-12 ENCOUNTER — Other Ambulatory Visit: Payer: Self-pay

## 2018-09-12 ENCOUNTER — Encounter (HOSPITAL_COMMUNITY): Payer: Self-pay | Admitting: Internal Medicine

## 2018-09-12 DIAGNOSIS — D631 Anemia in chronic kidney disease: Secondary | ICD-10-CM | POA: Diagnosis present

## 2018-09-12 DIAGNOSIS — Z992 Dependence on renal dialysis: Secondary | ICD-10-CM

## 2018-09-12 DIAGNOSIS — N189 Chronic kidney disease, unspecified: Secondary | ICD-10-CM | POA: Diagnosis present

## 2018-09-12 DIAGNOSIS — Z20828 Contact with and (suspected) exposure to other viral communicable diseases: Secondary | ICD-10-CM | POA: Diagnosis present

## 2018-09-12 DIAGNOSIS — Z955 Presence of coronary angioplasty implant and graft: Secondary | ICD-10-CM | POA: Diagnosis not present

## 2018-09-12 DIAGNOSIS — M1990 Unspecified osteoarthritis, unspecified site: Secondary | ICD-10-CM | POA: Diagnosis present

## 2018-09-12 DIAGNOSIS — Z791 Long term (current) use of non-steroidal anti-inflammatories (NSAID): Secondary | ICD-10-CM | POA: Diagnosis not present

## 2018-09-12 DIAGNOSIS — I10 Essential (primary) hypertension: Secondary | ICD-10-CM | POA: Diagnosis present

## 2018-09-12 DIAGNOSIS — G4733 Obstructive sleep apnea (adult) (pediatric): Secondary | ICD-10-CM

## 2018-09-12 DIAGNOSIS — K219 Gastro-esophageal reflux disease without esophagitis: Secondary | ICD-10-CM | POA: Diagnosis present

## 2018-09-12 DIAGNOSIS — Z841 Family history of disorders of kidney and ureter: Secondary | ICD-10-CM | POA: Diagnosis not present

## 2018-09-12 DIAGNOSIS — R6510 Systemic inflammatory response syndrome (SIRS) of non-infectious origin without acute organ dysfunction: Secondary | ICD-10-CM | POA: Diagnosis present

## 2018-09-12 DIAGNOSIS — E1129 Type 2 diabetes mellitus with other diabetic kidney complication: Secondary | ICD-10-CM | POA: Diagnosis present

## 2018-09-12 DIAGNOSIS — N186 End stage renal disease: Secondary | ICD-10-CM | POA: Diagnosis present

## 2018-09-12 DIAGNOSIS — G43909 Migraine, unspecified, not intractable, without status migrainosus: Secondary | ICD-10-CM | POA: Diagnosis present

## 2018-09-12 DIAGNOSIS — E11649 Type 2 diabetes mellitus with hypoglycemia without coma: Secondary | ICD-10-CM | POA: Diagnosis present

## 2018-09-12 DIAGNOSIS — J189 Pneumonia, unspecified organism: Secondary | ICD-10-CM | POA: Diagnosis present

## 2018-09-12 DIAGNOSIS — I2510 Atherosclerotic heart disease of native coronary artery without angina pectoris: Secondary | ICD-10-CM | POA: Diagnosis present

## 2018-09-12 DIAGNOSIS — Z6841 Body Mass Index (BMI) 40.0 and over, adult: Secondary | ICD-10-CM | POA: Diagnosis not present

## 2018-09-12 DIAGNOSIS — N2581 Secondary hyperparathyroidism of renal origin: Secondary | ICD-10-CM | POA: Diagnosis present

## 2018-09-12 DIAGNOSIS — R509 Fever, unspecified: Secondary | ICD-10-CM

## 2018-09-12 DIAGNOSIS — E1122 Type 2 diabetes mellitus with diabetic chronic kidney disease: Secondary | ICD-10-CM | POA: Diagnosis present

## 2018-09-12 DIAGNOSIS — A419 Sepsis, unspecified organism: Secondary | ICD-10-CM | POA: Diagnosis present

## 2018-09-12 DIAGNOSIS — F17200 Nicotine dependence, unspecified, uncomplicated: Secondary | ICD-10-CM

## 2018-09-12 DIAGNOSIS — E162 Hypoglycemia, unspecified: Secondary | ICD-10-CM

## 2018-09-12 DIAGNOSIS — Z794 Long term (current) use of insulin: Secondary | ICD-10-CM | POA: Diagnosis not present

## 2018-09-12 DIAGNOSIS — J440 Chronic obstructive pulmonary disease with acute lower respiratory infection: Secondary | ICD-10-CM | POA: Diagnosis present

## 2018-09-12 DIAGNOSIS — F17210 Nicotine dependence, cigarettes, uncomplicated: Secondary | ICD-10-CM | POA: Diagnosis present

## 2018-09-12 DIAGNOSIS — I120 Hypertensive chronic kidney disease with stage 5 chronic kidney disease or end stage renal disease: Secondary | ICD-10-CM | POA: Diagnosis present

## 2018-09-12 DIAGNOSIS — E785 Hyperlipidemia, unspecified: Secondary | ICD-10-CM | POA: Diagnosis present

## 2018-09-12 LAB — RESPIRATORY PANEL BY PCR

## 2018-09-12 LAB — CBC WITH DIFFERENTIAL/PLATELET
Abs Immature Granulocytes: 0.06 10*3/uL (ref 0.00–0.07)
Basophils Absolute: 0 10*3/uL (ref 0.0–0.1)
Basophils Relative: 0 %
Eosinophils Absolute: 0 10*3/uL (ref 0.0–0.5)
Eosinophils Relative: 0 %
HCT: 28.6 % — ABNORMAL LOW (ref 39.0–52.0)
Hemoglobin: 9.3 g/dL — ABNORMAL LOW (ref 13.0–17.0)
Immature Granulocytes: 1 %
Lymphocytes Relative: 7 %
Lymphs Abs: 0.7 10*3/uL (ref 0.7–4.0)
MCH: 34.7 pg — ABNORMAL HIGH (ref 26.0–34.0)
MCHC: 32.5 g/dL (ref 30.0–36.0)
MCV: 106.7 fL — ABNORMAL HIGH (ref 80.0–100.0)
Monocytes Absolute: 0.6 10*3/uL (ref 0.1–1.0)
Monocytes Relative: 7 %
Neutro Abs: 8.1 10*3/uL — ABNORMAL HIGH (ref 1.7–7.7)
Neutrophils Relative %: 85 %
Platelets: 128 10*3/uL — ABNORMAL LOW (ref 150–400)
RBC: 2.68 MIL/uL — ABNORMAL LOW (ref 4.22–5.81)
RDW: 15.3 % (ref 11.5–15.5)
WBC: 9.4 10*3/uL (ref 4.0–10.5)
nRBC: 0 % (ref 0.0–0.2)

## 2018-09-12 LAB — GLUCOSE, CAPILLARY
Glucose-Capillary: 46 mg/dL — ABNORMAL LOW (ref 70–99)
Glucose-Capillary: 52 mg/dL — ABNORMAL LOW (ref 70–99)
Glucose-Capillary: 66 mg/dL — ABNORMAL LOW (ref 70–99)
Glucose-Capillary: 82 mg/dL (ref 70–99)
Glucose-Capillary: 101 mg/dL — ABNORMAL HIGH (ref 70–99)
Glucose-Capillary: 92 mg/dL (ref 70–99)
Glucose-Capillary: 49 mg/dL — ABNORMAL LOW (ref 70–99)
Glucose-Capillary: 82 mg/dL (ref 70–99)
Glucose-Capillary: 43 mg/dL — CL (ref 70–99)
Glucose-Capillary: 56 mg/dL — ABNORMAL LOW (ref 70–99)
Glucose-Capillary: 64 mg/dL — ABNORMAL LOW (ref 70–99)
Glucose-Capillary: 79 mg/dL (ref 70–99)

## 2018-09-12 LAB — COMPREHENSIVE METABOLIC PANEL
ALT: 18 U/L (ref 0–44)
AST: 16 U/L (ref 15–41)
Albumin: 2.8 g/dL — ABNORMAL LOW (ref 3.5–5.0)
Alkaline Phosphatase: 23 U/L — ABNORMAL LOW (ref 38–126)
Anion gap: 16 — ABNORMAL HIGH (ref 5–15)
BUN: 61 mg/dL — ABNORMAL HIGH (ref 6–20)
CO2: 27 mmol/L (ref 22–32)
Calcium: 7.7 mg/dL — ABNORMAL LOW (ref 8.9–10.3)
Chloride: 98 mmol/L (ref 98–111)
Creatinine, Ser: 14.07 mg/dL — ABNORMAL HIGH (ref 0.61–1.24)
GFR calc Af Amer: 4 mL/min — ABNORMAL LOW (ref >60)
GFR calc non Af Amer: 3 mL/min — ABNORMAL LOW (ref >60)
Glucose, Bld: 90 mg/dL (ref 70–99)
Potassium: 3.9 mmol/L (ref 3.5–5.1)
Sodium: 141 mmol/L (ref 135–145)
Total Bilirubin: 0.8 mg/dL (ref 0.3–1.2)
Total Protein: 6 g/dL — ABNORMAL LOW (ref 6.5–8.1)

## 2018-09-12 LAB — HIV ANTIBODY (ROUTINE TESTING W REFLEX): HIV Screen 4th Generation wRfx: NONREACTIVE

## 2018-09-12 LAB — LACTIC ACID, PLASMA
Lactic Acid, Venous: 1.3 mmol/L (ref 0.5–1.9)
Lactic Acid, Venous: 0.9 mmol/L (ref 0.5–1.9)

## 2018-09-12 LAB — PROCALCITONIN: Procalcitonin: 0.87 ng/mL

## 2018-09-12 LAB — SARS CORONAVIRUS 2: SARS Coronavirus 2: NOT DETECTED

## 2018-09-12 LAB — HEMOGLOBIN A1C
Hgb A1c MFr Bld: 5.8 % — ABNORMAL HIGH (ref 4.8–5.6)
Mean Plasma Glucose: 119.76 mg/dL

## 2018-09-12 LAB — MRSA PCR SCREENING: MRSA by PCR: NEGATIVE

## 2018-09-12 MED ORDER — SODIUM CHLORIDE 0.9 % IV SOLN
2.0000 g | INTRAVENOUS | Status: DC
  Administered 2018-09-13 – 2018-09-14 (×2): 2 g via INTRAVENOUS
  Filled 2018-09-12 (×2): qty 2

## 2018-09-12 MED ORDER — IPRATROPIUM-ALBUTEROL 0.5-2.5 (3) MG/3ML IN SOLN
3.0000 mL | Freq: Four times a day (QID) | RESPIRATORY_TRACT | Status: DC
  Administered 2018-09-12: 14:00:00 3 mL via RESPIRATORY_TRACT
  Filled 2018-09-12: qty 3

## 2018-09-12 MED ORDER — ATORVASTATIN CALCIUM 80 MG PO TABS
80.0000 mg | ORAL_TABLET | Freq: Every day | ORAL | Status: DC
  Administered 2018-09-12 – 2018-09-15 (×4): 80 mg via ORAL
  Filled 2018-09-12 (×4): qty 1

## 2018-09-12 MED ORDER — ADULT MULTIVITAMIN W/MINERALS CH
1.0000 | ORAL_TABLET | Freq: Every day | ORAL | Status: DC
  Administered 2018-09-12 – 2018-09-15 (×4): 1 via ORAL
  Filled 2018-09-12 (×4): qty 1

## 2018-09-12 MED ORDER — NIFEDIPINE ER OSMOTIC RELEASE 90 MG PO TB24
90.0000 mg | ORAL_TABLET | Freq: Every day | ORAL | Status: DC
  Administered 2018-09-13 – 2018-09-15 (×3): 90 mg via ORAL
  Filled 2018-09-12 (×4): qty 1

## 2018-09-12 MED ORDER — ASPIRIN 81 MG PO CHEW
81.0000 mg | CHEWABLE_TABLET | Freq: Every day | ORAL | Status: DC
  Administered 2018-09-12 – 2018-09-15 (×4): 81 mg via ORAL
  Filled 2018-09-12 (×4): qty 1

## 2018-09-12 MED ORDER — CLOPIDOGREL BISULFATE 75 MG PO TABS
75.0000 mg | ORAL_TABLET | Freq: Every day | ORAL | Status: DC
  Administered 2018-09-12 – 2018-09-15 (×4): 75 mg via ORAL
  Filled 2018-09-12 (×4): qty 1

## 2018-09-12 MED ORDER — ATENOLOL 50 MG PO TABS
100.0000 mg | ORAL_TABLET | Freq: Every day | ORAL | Status: DC
  Administered 2018-09-13 – 2018-09-15 (×3): 100 mg via ORAL
  Filled 2018-09-12 (×3): qty 2

## 2018-09-12 MED ORDER — FUROSEMIDE 40 MG PO TABS
40.0000 mg | ORAL_TABLET | Freq: Every day | ORAL | Status: DC
  Administered 2018-09-12 – 2018-09-14 (×3): 40 mg via ORAL
  Filled 2018-09-12 (×3): qty 1

## 2018-09-12 MED ORDER — OMEGA-3-ACID ETHYL ESTERS 1 G PO CAPS
1.0000 | ORAL_CAPSULE | Freq: Three times a day (TID) | ORAL | Status: DC
  Administered 2018-09-12 – 2018-09-15 (×8): 1 g via ORAL
  Filled 2018-09-12 (×8): qty 1

## 2018-09-12 MED ORDER — HYDRALAZINE HCL 50 MG PO TABS
50.0000 mg | ORAL_TABLET | Freq: Three times a day (TID) | ORAL | Status: DC
  Administered 2018-09-13 – 2018-09-15 (×5): 50 mg via ORAL
  Filled 2018-09-12 (×6): qty 1

## 2018-09-12 MED ORDER — INSULIN ASPART 100 UNIT/ML ~~LOC~~ SOLN
0.0000 [IU] | Freq: Three times a day (TID) | SUBCUTANEOUS | Status: DC
  Administered 2018-09-13: 1 [IU] via SUBCUTANEOUS
  Administered 2018-09-14: 2 [IU] via SUBCUTANEOUS

## 2018-09-12 MED ORDER — ACETAMINOPHEN 325 MG PO TABS
650.0000 mg | ORAL_TABLET | Freq: Four times a day (QID) | ORAL | Status: DC | PRN
  Administered 2018-09-12: 06:00:00 650 mg via ORAL
  Filled 2018-09-12: qty 2

## 2018-09-12 MED ORDER — SODIUM CHLORIDE 0.9 % IV SOLN
2.0000 g | INTRAVENOUS | Status: AC
  Administered 2018-09-12: 2 g via INTRAVENOUS
  Filled 2018-09-12: qty 2

## 2018-09-12 MED ORDER — FUROSEMIDE 80 MG PO TABS
80.0000 mg | ORAL_TABLET | Freq: Every day | ORAL | Status: DC
  Administered 2018-09-12 – 2018-09-15 (×3): 80 mg via ORAL
  Filled 2018-09-12 (×2): qty 1
  Filled 2018-09-12: qty 2

## 2018-09-12 MED ORDER — GLUCOSE 4 G PO CHEW
CHEWABLE_TABLET | ORAL | Status: AC
  Administered 2018-09-12: 19:00:00 4 g
  Filled 2018-09-12: qty 1

## 2018-09-12 MED ORDER — SODIUM CHLORIDE 0.9 % IV SOLN
100.0000 mg | Freq: Two times a day (BID) | INTRAVENOUS | Status: DC
  Administered 2018-09-12 – 2018-09-13 (×2): 100 mg via INTRAVENOUS
  Filled 2018-09-12 (×3): qty 100

## 2018-09-12 MED ORDER — VANCOMYCIN HCL IN DEXTROSE 1-5 GM/200ML-% IV SOLN
1000.0000 mg | INTRAVENOUS | Status: DC
  Administered 2018-09-13 – 2018-09-14 (×2): 1000 mg via INTRAVENOUS
  Filled 2018-09-12 (×2): qty 200

## 2018-09-12 MED ORDER — FLUTICASONE PROPIONATE 50 MCG/ACT NA SUSP
1.0000 | Freq: Every day | NASAL | Status: DC
  Administered 2018-09-12 – 2018-09-15 (×4): 1 via NASAL
  Filled 2018-09-12: qty 16

## 2018-09-12 MED ORDER — VANCOMYCIN HCL 10 G IV SOLR
2500.0000 mg | Freq: Once | INTRAVENOUS | Status: AC
  Administered 2018-09-12: 13:00:00 2500 mg via INTRAVENOUS
  Filled 2018-09-12: qty 2500

## 2018-09-12 MED ORDER — VANCOMYCIN HCL IN DEXTROSE 1-5 GM/200ML-% IV SOLN: 1000.0000 mg | INTRAVENOUS | Status: DC

## 2018-09-12 MED ORDER — FENOFIBRATE 160 MG PO TABS
160.0000 mg | ORAL_TABLET | Freq: Every day | ORAL | Status: DC
  Administered 2018-09-12 – 2018-09-15 (×4): 160 mg via ORAL
  Filled 2018-09-12 (×4): qty 1

## 2018-09-12 MED ORDER — ACETAMINOPHEN 650 MG RE SUPP: 650.0000 mg | Freq: Four times a day (QID) | RECTAL | Status: DC | PRN

## 2018-09-12 MED ORDER — ESCITALOPRAM OXALATE 20 MG PO TABS
20.0000 mg | ORAL_TABLET | Freq: Every day | ORAL | Status: DC
  Administered 2018-09-12 – 2018-09-15 (×4): 20 mg via ORAL
  Filled 2018-09-12 (×4): qty 1

## 2018-09-12 MED ORDER — HEPARIN SODIUM (PORCINE) 5000 UNIT/ML IJ SOLN
5000.0000 [IU] | Freq: Three times a day (TID) | INTRAMUSCULAR | Status: DC
  Filled 2018-09-12 (×4): qty 1

## 2018-09-12 MED ORDER — INSULIN ASPART 100 UNIT/ML ~~LOC~~ SOLN: 0.0000 [IU] | Freq: Three times a day (TID) | SUBCUTANEOUS | Status: DC

## 2018-09-12 MED ORDER — HEPARIN SODIUM (PORCINE) 1000 UNIT/ML IJ SOLN
INTRAMUSCULAR | Status: AC
  Administered 2018-09-12: 23:00:00 4500 [IU] via INTRAVENOUS_CENTRAL
  Filled 2018-09-12: qty 1

## 2018-09-12 MED ORDER — GUAIFENESIN ER 600 MG PO TB12
600.0000 mg | ORAL_TABLET | Freq: Two times a day (BID) | ORAL | Status: DC
  Administered 2018-09-12 – 2018-09-15 (×6): 600 mg via ORAL
  Filled 2018-09-12 (×6): qty 1

## 2018-09-12 MED ORDER — CHLORHEXIDINE GLUCONATE CLOTH 2 % EX PADS
6.0000 | MEDICATED_PAD | Freq: Every day | CUTANEOUS | Status: DC
  Administered 2018-09-13 – 2018-09-15 (×2): 6 via TOPICAL

## 2018-09-12 MED ORDER — IPRATROPIUM-ALBUTEROL 0.5-2.5 (3) MG/3ML IN SOLN: 3.0000 mL | Freq: Two times a day (BID) | RESPIRATORY_TRACT | Status: DC

## 2018-09-12 MED ORDER — SODIUM CHLORIDE 0.9 % IV SOLN: 2.0000 g | INTRAVENOUS | Status: DC

## 2018-09-12 MED ORDER — SPIRONOLACTONE 25 MG PO TABS
25.0000 mg | ORAL_TABLET | Freq: Every day | ORAL | Status: DC
  Administered 2018-09-12 – 2018-09-15 (×4): 25 mg via ORAL
  Filled 2018-09-12 (×4): qty 1

## 2018-09-12 MED ORDER — ACETAMINOPHEN 325 MG PO TABS
650.0000 mg | ORAL_TABLET | ORAL | Status: DC | PRN
  Administered 2018-09-12 – 2018-09-14 (×6): 650 mg via ORAL
  Filled 2018-09-12 (×6): qty 2

## 2018-09-12 MED ORDER — PNEUMOCOCCAL VAC POLYVALENT 25 MCG/0.5ML IJ INJ
0.5000 mL | INJECTION | INTRAMUSCULAR | Status: DC
  Filled 2018-09-12: qty 0.5

## 2018-09-12 MED ORDER — VITAMIN B-12 1000 MCG PO TABS
500.0000 ug | ORAL_TABLET | Freq: Every day | ORAL | Status: DC
  Administered 2018-09-12 – 2018-09-15 (×4): 500 ug via ORAL
  Filled 2018-09-12 (×5): qty 1

## 2018-09-12 MED ORDER — SALINE SPRAY 0.65 % NA SOLN
1.0000 | NASAL | Status: DC | PRN
  Filled 2018-09-12: qty 44

## 2018-09-12 NOTE — ED Provider Notes (Signed)
[place] EMERGENCY DEPARTMENT Provider Note   CSN: [id number] Arrival date & time: 09/11/18  1943     History   Chief Complaint Chief Complaint  Patient presents with  . Fever    HPI Earl Mathenia. is a 53 y.o. male.     The history is provided by the patient.  Fever Severity:  Moderate Onset quality:  Gradual Duration:  2 days Timing:  Constant Progression:  Worsening Chronicity:  New Relieved by:  Nothing Worsened by:  Nothing Associated symptoms: chills, cough and headaches   Associated symptoms: no chest pain, no diarrhea, no rash and no vomiting    Patient with history of ESRD, GERD, diabetes presents with fever for 2 days.  He reports mild cough that is typical for his smoker's cough.  He reports mild headache and body aches.  He denies any vomiting or diarrhea. No rash or tick bites.  He gets dialysis 3 days a week without issue in [place] No known pelvic contacts but he has been golfing and swimming.  Past Medical History:  Diagnosis Date  . Arthritis   . Degenerative disc disease, lumbar   . Diabetes ([place])    type 2  . GERD (gastroesophageal reflux disease)   . Heart disease   . Heartburn   . Hemorrhoid   . HLD (hyperlipidemia)   . HTN (hypertension)   . Hydrocele   . Sleep apnea     Patient Active Problem List   Diagnosis Date Noted  . Other long term (current) drug therapy 09/18/2012  . Abnormal presence of protein in urine 08/27/2012    Past Surgical History:  Procedure Laterality Date  . CARPAL TUNNEL RELEASE  2006  . COLONOSCOPY  1992  . CORONARY ANGIOPLASTY WITH STENT PLACEMENT  2005  . POLYPECTOMY  1993   throat  . ROTATOR CUFF REPAIR  2004        Home Medications    Prior to Admission medications   Medication Sig Start Date End Date Taking? Authorizing Provider  aspirin 81 MG chewable tablet Chew 81 mg by mouth.    [provider]  atenolol (TENORMIN) 100 MG tablet Take 100 mg by mouth.  Reported on 06/01/2015    [provider]  atorvastatin (LIPITOR) 80 MG tablet Take 80 mg by mouth.    [provider]  Azilsartan-Chlorthalidone 40-25 MG TABS Take by mouth. Reported on 06/01/2015    [provider]  buprenorphine (BUTRANS) 10 MCG/HR [place] patch Place onto the skin. 04/28/15   [provider]  buPROPion (WELLBUTRIN) 75 MG tablet Take 75 mg by mouth.    [provider]  clopidogrel (PLAVIX) 75 MG tablet Take 75 mg by mouth.    [provider]  escitalopram (LEXAPRO) 20 MG tablet Take 20 mg by mouth.    [provider]  fenofibrate 160 MG tablet Take 160 mg by mouth. Reported on 06/01/2015    [provider]  furosemide (LASIX) 20 MG tablet Take 20 mg by mouth.    [provider]  glipiZIDE (GLUCOTROL XL) 5 MG 24 hr tablet Take 5 mg by mouth. Reported on 06/01/2015 11/21/14   [provider]  hydrALAZINE (APRESOLINE) 50 MG tablet Take 50 mg by mouth.    [provider]  ibuprofen (ADVIL,MOTRIN) 800 MG tablet Reported on 06/01/2015 04/12/15   [provider]  insulin glargine (LANTUS) 100 UNIT/ML injection Inject into the skin at bedtime. Reported on 06/22/2015  [provider]  linagliptin (TRADJENTA) 5 MG TABS tablet Take 5 mg by mouth.    [provider]  Liraglutide (VICTOZA) 18 MG/3ML SOPN Inject 1.8 mg into the skin.    [provider]  methocarbamol (ROBAXIN-750) 750 MG tablet Take 750 mg by mouth. 04/28/15   [provider]  morphine (MS CONTIN) 15 MG 12 hr tablet Take 15 mg by mouth as needed. Reported on 06/15/2015 10/14/14   [provider]  Multiple Vitamin (MULTI-VITAMINS) TABS Take by mouth.    [provider]  Nebivolol HCl (BYSTOLIC) 20 MG TABS Take 20 mg by mouth.    [provider]  NIFEdipine (ADALAT CC) 90 MG 24 hr tablet Take 90 mg by mouth.    [provider]  Omega-3 1000 MG CAPS Take by mouth.     [provider]  omeprazole (PRILOSEC) 20 MG capsule Take 20 mg by mouth. Reported on 06/01/2015    [provider]  potassium chloride (KLOR-CON) 20 MEQ packet Take by mouth.    [provider]  pregabalin (LYRICA) 100 MG capsule Take 100 mg by mouth. 04/28/15   [provider]  spironolactone (ALDACTONE) 25 MG tablet  05/05/15   [provider]  valsartan (DIOVAN) 320 MG tablet Take 320 mg by mouth.    [provider]  vitamin B-12 (CYANOCOBALAMIN) 500 MCG tablet Take by mouth. Reported on 06/01/2015    [provider]  Vitamin D, Ergocalciferol, (DRISDOL) 50000 units CAPS capsule Take by mouth.    [provider]    Family History Family History  Problem Relation Age of Onset  . Kidney disease Father   . Prostate cancer Neg Hx     Social History Social History   Tobacco Use  . Smoking status: Current Every Day Smoker    Packs/day: 0.50    Years: 1.00    Pack years: 0.50  . Smokeless tobacco: Never Used  Substance Use Topics  . Alcohol use: Yes    Alcohol/week: 0.0 standard drinks  . Drug use: No     Allergies   Patient has no known allergies.   Review of Systems Review of Systems  Constitutional: Positive for chills and fever.  Respiratory: Positive for cough.   Cardiovascular: Negative for chest pain.  Gastrointestinal: Negative for diarrhea and vomiting.  Skin: Negative for rash.  Neurological: Positive for headaches.  All other systems reviewed and are negative.    Physical Exam Updated Vital Signs BP (!) 145/86 (BP Location: Right Arm)   Pulse (!) 102   Temp (!) 100.5 F (38.1 C) (Oral)   Resp (!) 9   Ht 1.829 m (6')   Wt (!) 137.4 kg   SpO2 94%   BMI 41.09 kg/m   Physical Exam CONSTITUTIONAL: Well developed/well nourished well-appearing HEAD: Normocephalic/atraumatic EYES: EOMI/PERRL ENMT: Mucous membranes moist NECK: supple no meningeal signs SPINE/BACK:entire spine nontender  CV: S1/S2 noted, no murmurs/rubs/gallops noted LUNGS: Lungs are clear to auscultation bilaterally, no apparent distress ABDOMEN: soft, nontender, no rebound or guarding, bowel sounds noted throughout abdomen GU:no cva tenderness NEURO: Pt is awake/alert/appropriate, moves all extremitiesx4.  No facial droop.   EXTREMITIES: pulses normal/equal, full ROM, HD access to left UE, thrill noted SKIN: warm, color normal PSYCH: no abnormalities of mood noted, alert and oriented to situation   ED Treatments / Results  Labs (all labs ordered are listed, but only abnormal results are displayed) Labs Reviewed  COMPREHENSIVE METABOLIC PANEL - Abnormal; Notable  for the following components:      Result Value   BUN 53 (*)    Creatinine, Ser 12.25 (*)    Calcium 7.7 (*)    Albumin 3.0 (*)    Alkaline Phosphatase 26 (*)    GFR calc non Af Amer 4 (*)    GFR calc Af Amer 5 (*)    All other components within normal limits  CBC WITH DIFFERENTIAL/PLATELET - Abnormal; Notable for the following components:   RBC 2.87 (*)    Hemoglobin 9.9 (*)    HCT 30.9 (*)    MCV 107.7 (*)    MCH 34.5 (*)    Platelets 144 (*)    Neutro Abs 8.2 (*)    Lymphs Abs 0.6 (*)    All other components within normal limits  PROTIME-INR - Abnormal; Notable for the following components:   Prothrombin Time 16.0 (*)    INR 1.3 (*)    All other components within normal limits  CULTURE, BLOOD (ROUTINE X 2)  CULTURE, BLOOD (ROUTINE X 2)  SARS CORONAVIRUS 2  LACTIC ACID, PLASMA  URINALYSIS, ROUTINE W REFLEX MICROSCOPIC    EKG    Radiology Dg Chest 2 View  Result Date: 09/11/2018 CLINICAL DATA:  Suspected sepsis. Fever for 2 days. EXAM: CHEST - 2 VIEW COMPARISON:  None. FINDINGS: Upper normal heart size with normal mediastinal contours. Mild hyperinflation and bronchial thickening. Pulmonary vasculature is normal. No confluent consolidation, pleural effusion, or pneumothorax. Multilevel degenerative change throughout  spine. Widening of the right acromioclavicular joint is likely postsurgical. No acute osseous abnormalities are seen. IMPRESSION: Mild hyperinflation and bronchial thickening suggesting bronchitis, asthma, or smoking related lung disease. No consolidation to suggest pneumonia. Electronically Signed   By: Keith Rake M.D.   On: 09/11/2018 21:24    Procedures Procedures   Medications Ordered in ED Medications  sodium chloride flush (NS) 0.9 % injection 3 mL (has no administration in time range)  acetaminophen (TYLENOL) tablet 650 mg (650 mg Oral Given 09/12/18 0129)     Initial Impression / Assessment and Plan / ED Course  I have reviewed the triage vital signs and the nursing notes.  Pertinent labs & imaging results that were available during my care of the patient were reviewed by me and considered in my medical decision making (see chart for details).        1:49 AM Patient presents for febrile illness for 2 days.  He is on dialysis.  He is well-appearing.  He is high risk for COVID.  He will be admitted.  If COVID negative, he will require IV antibiotics until cultures are resulted.  Discussed the case with Dr. Hal Hope.  Patient is not septic appearing     Earl Remmert. was evaluated in Emergency Department on 09/12/2018 for the symptoms described in the history of present illness. He was evaluated in the context of the global COVID-19 pandemic, which necessitated consideration that the patient might be at risk for infection with the SARS-CoV-2 virus that causes COVID-19. Institutional protocols and algorithms that pertain to the evaluation of patients at risk for COVID-19 are in a state of rapid change based on information released by regulatory bodies including the CDC and federal and state organizations. These policies and algorithms were followed during the patient's care in the ED.  Final Clinical Impressions(s) / ED Diagnoses   Final diagnoses:  ESRD (end stage renal  disease) ([place])  Acute febrile illness    ED Discharge Orders  None       Ripley Fraise, MD 09/12/18 [phone]

## 2018-09-12 NOTE — Progress Notes (Signed)
Updated pts wife Helene Kelp with plan of care

## 2018-09-12 NOTE — Progress Notes (Signed)
Hypoglycemic Event  CBG: 49  Treatment: 8 oz juice/soda   Symptoms: None  Follow-up CBG: Time:1322 CBG Result:82 Possible Reasons for Event: Unknown  Comments/MD notified:    Luci Bank

## 2018-09-12 NOTE — Consult Note (Signed)
[facility] Renal Consultation Note    Indication for Consultation:  Management of ESRD/hemodialysis; anemia, hypertension/volume and secondary hyperparathyroidism  HPI: Earl Middleton. is a 53 y.o. male.  Earl Middleton has a PMH significant for DM type 2, morbid obesity, OSA, HTN, and ESRD who presented to the ED with 2 day history of fevers, chills, and cough.  He was found to be tachycardic and febrile to 102.  Initial covid-19 test was negative and was admitted for IV abx and management of SIRS.  We were consulted to help provide HD while he remains an inpatient.  The patient denies any known exposure to Covid infected patients but his HD unit did have 4 + patients on a separate shift (nursing home patients).  He denies any issues with HD but did state that he was referred for a fistulogram for declining access flows.  Past Medical History:  Diagnosis Date  . Arthritis   . Degenerative disc disease, lumbar   . Diabetes ([place])    type 2  . GERD (gastroesophageal reflux disease)   . Heart disease   . Heartburn   . Hemorrhoid   . HLD (hyperlipidemia)   . HTN (hypertension)   . Hydrocele   . Sleep apnea    Past Surgical History:  Procedure Laterality Date  . CARPAL TUNNEL RELEASE  2006  . COLONOSCOPY  1992  . CORONARY ANGIOPLASTY WITH STENT PLACEMENT  2005  . POLYPECTOMY  1993   throat  . ROTATOR CUFF REPAIR  2004   Family History:   Family History  Problem Relation Age of Onset  . Kidney disease Father   . Prostate cancer Neg Hx    Social History:  reports that he has been smoking. He has a 0.50 pack-year smoking history. He has never used smokeless tobacco. He reports current alcohol use. He reports that he does not use drugs. No Known Allergies Prior to Admission medications   Medication Sig Start Date End Date Taking? Authorizing Provider  aspirin 81 MG chewable tablet Chew 81 mg by mouth daily.    Yes [provider]  atenolol (TENORMIN) 100 MG  tablet Take 100 mg by mouth daily. Reported on 06/01/2015   Yes [provider]  atorvastatin (LIPITOR) 80 MG tablet Take 80 mg by mouth daily.    Yes [provider]  clopidogrel (PLAVIX) 75 MG tablet Take 75 mg by mouth daily.    Yes [provider]  escitalopram (LEXAPRO) 20 MG tablet Take 20 mg by mouth.   Yes [provider]  fenofibrate 160 MG tablet Take 160 mg by mouth daily. Reported on 06/01/2015   Yes [provider]  furosemide (LASIX) 20 MG tablet Take 40-80 mg by mouth 2 (two) times daily. 80 mg in the morning and 40 mg in the afternoon   Yes [provider]  glipiZIDE (GLUCOTROL XL) 5 MG 24 hr tablet Take 5 mg by mouth daily. Reported on 06/01/2015 11/21/14  Yes [provider]  hydrALAZINE (APRESOLINE) 50 MG tablet Take 50 mg by mouth 3 (three) times daily.    Yes [provider]  ibuprofen (ADVIL,MOTRIN) 800 MG tablet Take 800 mg by mouth every 6 (six) hours as needed for moderate pain.  04/12/15  Yes [provider]  Multiple Vitamin (MULTI-VITAMINS) TABS Take 1 tablet by mouth daily.    Yes [provider]  NIFEdipine (ADALAT CC) 90 MG 24 hr tablet Take 90 mg by mouth daily.  Yes [provider]  Omega-3 1000 MG CAPS Take 1 capsule by mouth 3 (three) times daily.    Yes [provider]  spironolactone (ALDACTONE) 25 MG tablet Take 25 mg by mouth daily.  05/05/15  Yes [provider]  vitamin B-12 (CYANOCOBALAMIN) 500 MCG tablet Take 500 mcg by mouth daily. Reported on 06/01/2015   Yes [provider]   Current Facility-Administered Medications  Medication Dose Route Frequency Provider Last Rate Last Dose  . acetaminophen (TYLENOL) tablet 650 mg  650 mg Oral Q4H PRN Wendee Beavers T, MD   650 mg at 09/12/18 1125   Or  . acetaminophen (TYLENOL) suppository 650 mg  650 mg Rectal Q6H PRN Wendee Beavers T, MD      . aspirin chewable tablet 81 mg  81 mg Oral Daily Rise Patience, MD   81 mg at 09/12/18 1248  . atenolol (TENORMIN) tablet 100 mg  100 mg Oral Daily Rise Patience, MD      . atorvastatin (LIPITOR) tablet 80 mg  80 mg Oral Daily Rise Patience, MD      . Derrill Memo ON 09/14/2018] ceFEPIme (MAXIPIME) 2 g in sodium chloride 0.9 % 100 mL IVPB  2 g Intravenous Q T,Th,Sat-1800 Gonfa, Taye T, MD      . clopidogrel (PLAVIX) tablet 75 mg  75 mg Oral Daily Rise Patience, MD      . escitalopram (LEXAPRO) tablet 20 mg  20 mg Oral Daily Rise Patience, MD   20 mg at 09/12/18 0919  . fenofibrate tablet 160 mg  160 mg Oral Daily Rise Patience, MD      . fluticasone [facility]) 50 MCG/ACT nasal spray 1 spray  1 spray Each Nare Daily Wendee Beavers T, MD   1 spray at 09/12/18 1051  . furosemide (LASIX) tablet 40 mg  40 mg Oral q1800 Rise Patience, MD      . furosemide (LASIX) tablet 80 mg  80 mg Oral QAC breakfast Rise Patience, MD   80 mg at 09/12/18 0919  . guaiFENesin (MUCINEX) 12 hr tablet 600 mg  600 mg Oral BID Wendee Beavers T, MD      . heparin injection 5,000 Units  5,000 Units Subcutaneous Q8H Rise Patience, MD      . hydrALAZINE (APRESOLINE) tablet 50 mg  50 mg Oral TID Rise Patience, MD      . insulin aspart (novoLOG) injection 0-6 Units  0-6 Units Subcutaneous TID WC Gonfa, Taye T, MD      . ipratropium-albuterol (DUONEB) 0.5-2.5 (3) MG/3ML nebulizer solution 3 mL  3 mL Nebulization Q6H Gonfa, Taye T, MD      . multivitamin with minerals tablet 1 tablet  1 tablet Oral Daily Rise Patience, MD   1 tablet at 09/12/18 0919  . NIFEdipine (PROCARDIA XL/NIFEDICAL-XL) 24 hr tablet 90 mg  90 mg Oral Daily Rise Patience, MD      . omega-3 acid ethyl esters (LOVAZA) capsule 1 g  1 capsule Oral TID Rise Patience, MD      . Derrill Memo ON 09/13/2018] pneumococcal 23 valent vaccine (PNU-IMMUNE) injection 0.5 mL  0.5 mL Intramuscular Tomorrow-1000 Rise Patience, MD      . sodium chloride (OCEAN) 0.65  % nasal spray 1 spray  1 spray Each Nare PRN Gonfa, Taye T, MD      . sodium chloride flush (NS) 0.9 % injection 3 mL  3 mL  Intravenous Once Rise Patience, MD      . spironolactone (ALDACTONE) tablet 25 mg  25 mg Oral Daily Rise Patience, MD      . vancomycin (VANCOCIN) 2,500 mg in sodium chloride 0.9 % 500 mL IVPB  2,500 mg Intravenous Once Wendee Beavers T, MD 250 mL/hr at 09/12/18 1250 2,500 mg at 09/12/18 1250  . [START ON 09/14/2018] vancomycin (VANCOCIN) IVPB 1000 mg/200 mL premix  1,000 mg Intravenous Q T,Th,Sa-HD Wendee Beavers T, MD      . vitamin B-12 (CYANOCOBALAMIN) tablet 500 mcg  500 mcg Oral Daily Rise Patience, MD       Labs: Basic Metabolic Panel: Recent Labs  Lab 09/11/18 2037 09/12/18 0634  NA 141 141  K 4.0 3.9  CL 98 98  CO2 28 27  GLUCOSE 99 90  BUN 53* 61*  CREATININE 12.25* 14.07*  CALCIUM 7.7* 7.7*   Liver Function Tests: Recent Labs  Lab 09/11/18 2037 09/12/18 0634  AST 19 16  ALT 19 18  ALKPHOS 26* 23*  BILITOT 0.8 0.8  PROT 6.5 6.0*  ALBUMIN 3.0* 2.8*   No results for input(s): LIPASE, AMYLASE in the last 168 hours. No results for input(s): AMMONIA in the last 168 hours. CBC: Recent Labs  Lab 09/11/18 2037 09/12/18 0634  WBC 9.6 9.4  NEUTROABS 8.2* 8.1*  HGB 9.9* 9.3*  HCT 30.9* 28.6*  MCV 107.7* 106.7*  PLT 144* 128*   Cardiac Enzymes: No results for input(s): CKTOTAL, CKMB, CKMBINDEX, TROPONINI in the last 168 hours. CBG: Recent Labs  Lab 09/12/18 0552 09/12/18 0706 09/12/18 0822 09/12/18 1235 09/12/18 1322  GLUCAP 82 101* 92 49* 82   Iron Studies: No results for input(s): IRON, TIBC, TRANSFERRIN, FERRITIN in the last 72 hours. Studies/Results: Dg Chest 2 View  Result Date: 09/11/2018 CLINICAL DATA:  Suspected sepsis. Fever for 2 days. EXAM: CHEST - 2 VIEW COMPARISON:  None. FINDINGS: Upper normal heart size with normal mediastinal contours. Mild hyperinflation and bronchial thickening. Pulmonary vasculature  is normal. No confluent consolidation, pleural effusion, or pneumothorax. Multilevel degenerative change throughout spine. Widening of the right acromioclavicular joint is likely postsurgical. No acute osseous abnormalities are seen. IMPRESSION: Mild hyperinflation and bronchial thickening suggesting bronchitis, asthma, or smoking related lung disease. No consolidation to suggest pneumonia. Electronically Signed   By: Keith Rake M.D.   On: 09/11/2018 21:24    ROS: Pertinent items are noted in HPI. Physical Exam: Vitals:   09/12/18 0412 09/12/18 0700 09/12/18 1115 09/12/18 1248  BP: 121/60     Pulse: 97     Resp:      Temp: 99.5 F (37.5 C)  (!) 101.7 F (38.7 C) (!) 101.2 F (38.4 C)  TempSrc: Oral  Oral Oral  SpO2: 95%     Weight:  (!) 140.3 kg    Height:  6' (1.829 m)        Weight change:   Intake/Output Summary (Last 24 hours) at 09/12/2018 1339 Last data filed at 09/12/2018 0918 Gross per 24 hour  Intake 240 ml  Output -  Net 240 ml   BP 121/60 (BP Location: Left Arm)   Pulse 97   Temp (!) 101.2 F (38.4 C) (Oral)   Resp 15   Ht 6' (1.829 m)   Wt (!) 140.3 kg   SpO2 95%   BMI 41.95 kg/m  General appearance: alert, cooperative and no distress Head: Normocephalic, without obvious abnormality, atraumatic Resp: clear to auscultation bilaterally GI:  soft, non-tender; bowel sounds normal; no masses,  no organomegaly Extremities: extremities normal, atraumatic, no cyanosis or edema and LAVF +T/B Dialysis Access:  Dialysis Orders: Center: [facility]  on TTS . EDW 136 kg HD Bath 2K/2.25Ca  Time 4:45 Heparin 3,000 unit bolus 1500 units mid treatment. Access LAVF BFR 450 DFR 800 micera 75 mcg IVP every 4 weeks (given 09/10/18)  Assessment/Plan: 1.  Fevers/SIRS- unclear source of infection.  Initial covid-19 negative but repeated today for send out.  Cultures pending.  No sores or ulcerations noted 2.  ESRD -  Plan for HD today 3.  Hypertension/volume  -  stable 4.  Anemia  - stable 5.  Metabolic bone disease -   Stable. On auryxia 420 mg qac 6.  Nutrition - renal diet, carbohydrate modified 7. Vascular access- access flows have been dropping. Will follow while he remains here but may require fistulogram.  Donetta Potts, MD [place] Pager [phone] 09/12/2018, 1:39 PM

## 2018-09-12 NOTE — Progress Notes (Signed)
Hypoglycemic Event  CBG: 43 Treatment: 4 oz juice/soda and 8 oz juice/soda  Symptoms: None  Follow-up CBG: Time:1858 CBG Result:56  Possible Reasons for Event: Unknown   Hypoglycemic Event  CBG: 56 Treatment: 4 oz juice/soda and 8 oz juice/soda  Symptoms: None  Follow-up CBG: Time: 1933 CBG Result:64  Possible Reasons for Event: Unknown  Comments/MD notified:oncoming RN notified      Luci Bank

## 2018-09-12 NOTE — Progress Notes (Signed)
Updated patient's wife over the phone.

## 2018-09-12 NOTE — ED Notes (Signed)
ED TO INPATIENT HANDOFF REPORT  ED Nurse Name and Phone #:   S Name/Age/Gender Jacqulyn Bath. 53 y.o. male Room/Bed: 035C/035C  Code Status   Code Status: Not on file  Home/SNF/Other Home Patient oriented to: self, place, time and situation Is this baseline? Yes   Triage Complete: Triage complete  Chief Complaint Fever,Dialysis Pt   Triage Note Pt in POV reporting fever X2 days. Reports a cough but states this is no different from his normal "smokers cough" Dialysis pt, T,Th,S. Did receive full treatment yesterday.    Allergies No Known Allergies  Level of Care/Admitting Diagnosis ED Disposition    None      B Medical/Surgery History Past Medical History:  Diagnosis Date  . Arthritis   . Degenerative disc disease, lumbar   . Diabetes ([place])    type 2  . GERD (gastroesophageal reflux disease)   . Heart disease   . Heartburn   . Hemorrhoid   . HLD (hyperlipidemia)   . HTN (hypertension)   . Hydrocele   . Sleep apnea    Past Surgical History:  Procedure Laterality Date  . CARPAL TUNNEL RELEASE  2006  . COLONOSCOPY  1992  . CORONARY ANGIOPLASTY WITH STENT PLACEMENT  2005  . POLYPECTOMY  1993   throat  . ROTATOR CUFF REPAIR  2004     A IV Location/Drains/Wounds Patient Lines/Drains/Airways Status   Active Line/Drains/Airways    None          Intake/Output Last 24 hours No intake or output data in the 24 hours ending 09/12/18 0128  Labs/Imaging Results for orders placed or performed during the hospital encounter of 09/11/18 (from the past 48 hour(s))  Comprehensive metabolic panel     Status: Abnormal   Collection Time: 09/11/18  8:37 PM  Result Value Ref Range   Sodium 141 135 - 145 mmol/L   Potassium 4.0 3.5 - 5.1 mmol/L   Chloride 98 98 - 111 mmol/L   CO2 28 22 - 32 mmol/L   Glucose, Bld 99 70 - 99 mg/dL   BUN 53 (H) 6 - 20 mg/dL   Creatinine, Ser 12.25 (H) 0.61 - 1.24 mg/dL   Calcium 7.7 (L) 8.9 - 10.3 mg/dL   Total Protein 6.5  6.5 - 8.1 g/dL   Albumin 3.0 (L) 3.5 - 5.0 g/dL   AST 19 15 - 41 U/L   ALT 19 0 - 44 U/L   Alkaline Phosphatase 26 (L) 38 - 126 U/L   Total Bilirubin 0.8 0.3 - 1.2 mg/dL   GFR calc non Af Amer 4 (L) >60 mL/min   GFR calc Af Amer 5 (L) >60 mL/min   Anion gap 15 5 - 15    Comment: Performed at [facility], [address], [place], [place] [address]  CBC with Differential     Status: Abnormal   Collection Time: 09/11/18  8:37 PM  Result Value Ref Range   WBC 9.6 4.0 - 10.5 K/uL   RBC 2.87 (L) 4.22 - 5.81 MIL/uL   Hemoglobin 9.9 (L) 13.0 - 17.0 g/dL   HCT 30.9 (L) 39.0 - 52.0 %   MCV 107.7 (H) 80.0 - 100.0 fL   MCH 34.5 (H) 26.0 - 34.0 pg   MCHC 32.0 30.0 - 36.0 g/dL   RDW 15.1 11.5 - 15.5 %   Platelets 144 (L) 150 - 400 K/uL    Comment: REPEATED TO VERIFY   nRBC 0.0 0.0 - 0.2 %  Neutrophils Relative % 87 %   Neutro Abs 8.2 (H) 1.7 - 7.7 K/uL   Lymphocytes Relative 6 %   Lymphs Abs 0.6 (L) 0.7 - 4.0 K/uL   Monocytes Relative 7 %   Monocytes Absolute 0.7 0.1 - 1.0 K/uL   Eosinophils Relative 0 %   Eosinophils Absolute 0.0 0.0 - 0.5 K/uL   Basophils Relative 0 %   Basophils Absolute 0.0 0.0 - 0.1 K/uL   Immature Granulocytes 0 %   Abs Immature Granulocytes 0.04 0.00 - 0.07 K/uL    Comment: Performed at [facility] [address], [place], [place] [address]  Protime-INR     Status: Abnormal   Collection Time: 09/11/18  8:37 PM  Result Value Ref Range   Prothrombin Time 16.0 (H) 11.4 - 15.2 seconds   INR 1.3 (H) 0.8 - 1.2    Comment: (NOTE) INR goal varies based on device and disease states. Performed at [facility], [place] [address], [place], [place] [address]   Lactic acid, plasma     Status: None   Collection Time: 09/11/18  8:38 PM  Result Value Ref Range   Lactic Acid, Venous 1.7 0.5 - 1.9 mmol/L    Comment: Performed at [facility] [address], [place], [place] [address]   Dg Chest 2 View  Result Date: 09/11/2018 CLINICAL DATA:   Suspected sepsis. Fever for 2 days. EXAM: CHEST - 2 VIEW COMPARISON:  None. FINDINGS: Upper normal heart size with normal mediastinal contours. Mild hyperinflation and bronchial thickening. Pulmonary vasculature is normal. No confluent consolidation, pleural effusion, or pneumothorax. Multilevel degenerative change throughout spine. Widening of the right acromioclavicular joint is likely postsurgical. No acute osseous abnormalities are seen. IMPRESSION: Mild hyperinflation and bronchial thickening suggesting bronchitis, asthma, or smoking related lung disease. No consolidation to suggest pneumonia. Electronically Signed   By: Keith Rake M.D.   On: 09/11/2018 21:24    Pending Labs Unresulted Labs (From admission, onward)    Start     Ordered   09/11/18 2355  SARS Coronavirus 2 (CEPHEID - Performed in [facility] hospital lab), Hosp Order  (Asymptomatic Patients Labs)  Once,   STAT    Question:  Rule Out  Answer:  Yes   09/11/18 2354   09/11/18 2010  Culture, blood (Routine x 2)  BLOOD CULTURE X 2,   STAT     09/11/18 2009   09/11/18 2010  Urinalysis, Routine w reflex microscopic  ONCE - STAT,   STAT     09/11/18 2009          Vitals/Pain Today's Vitals   09/11/18 2009 09/11/18 2013 09/11/18 2342  BP: 118/72  (!) 145/86  Pulse: 63  (!) 102  Resp: 20  (!) 9  Temp: (!) 102.9 F (39.4 C)  (!) 100.5 F (38.1 C)  TempSrc: Oral  Oral  SpO2: 97%  94%  Weight: (!) 137.4 kg    Height: 6' (1.829 m)    PainSc:  0-No pain     Isolation Precautions No active isolations  Medications Medications  sodium chloride flush (NS) 0.9 % injection 3 mL (has no administration in time range)  acetaminophen (TYLENOL) tablet 650 mg (has no administration in time range)    Mobility walks Low fall risk   Focused Assessments Renal Assessment Handoff:  Hemodialysis Schedule: Hemodialysis Schedule: Tuesday/Thursday/Saturday Last Hemodialysis date and time: 09/10/2018   Restricted appendage:  left leg     R Recommendations:  See Admitting Provider Note  Report given to:   Additional Notes:

## 2018-09-12 NOTE — Progress Notes (Addendum)
PROGRESS NOTE  Earl Middleton. KDX:[id number] DOB: 1965-09-05 DOA: 09/11/2018 PCP: Marzetta Board, DO   LOS: 0 days   Patient is from: Home.  Brief Narrative / Interim history: 53-year-old male with history of ESRD on HD TTS, CAD s/p PCI, HTN, DM-2, tobacco use disorder, anemia, OSA and morbid obesity presenting with fever and chills for the last 2 days.  Review of system otherwise negative except for chronic cough unchanged from baseline. No URI symptoms.  In ED, febrile to 103 F, tachycardic and mildly tachypneic.  CXR with chronic bronchitis picture.  COVID-19 negative.  CBC and BMP appears to be at baseline.  Lactic acid negative.  Patient was admitted for SIRS.   Subjective: No major events this morning.  Continues to spike fever.  He reports headache especially with cough.  He attributes this to his sinus.  He also has history of migraine headache.  He denies nuchal rigidity, vision change or other focal neuro symptoms.  Denies chest pain, dyspnea, GI or GU symptoms.  He denies skin ulcer except for some lower extremity skin breaks after scratching due to dialysis.  He denies sick contact, tick bite or unusual outdoor activity.    Assessment & Plan: Undifferentiated sepsis/SIRS: Patient continues to spike fever without obvious source of infection.  Has few skin describes on left lower extremity which appears clean and dry.  Review of systems negative except for baseline cough and associated headache.  No meningeal signs and symptoms.  CBC without leukocytosis.  Lactic acid within normal range.  Procalcitonin elevated.  CXR with with mild hyperinflation and bronchial thickening suggestive for bronchitis, asthma/COPD.  Patient is immunocompromised due to ESRD, DM-2, CAD, morbid obesity and tobacco use. -We will start vancomycin and cefepime per pharmacy -Follow cultures -We will obtain CT chest without contrast -Follow repeat culture test.  Chronic cough: CXR with mild hyperinflation,  bronchitis and possible asthma/COPD.  Patient is a smoker.  Currently smokes about half a pack a day. -We will start scheduled and PRN DuoNeb -Antibiotic as above -We will escalate to systemic steroid or ICS if no improvement  ESRD on HD TTS/BMD: No hypercalcemia or uremia. -Nephrology, Dr. Hollie Salk consulted  Anemia of chronic disease: Hemoglobin 9.3.  Baseline about 10-12. -Per nephrology  History of CAD status post stent: Stable.  No anginal symptoms. -Continue home atenolol, atorvastatin, Plavix and aspirin.  Controlled NIDDM-2 with renal complication and hypoglycemia: On glipizide at home.  Hypoglycemic to 49 at lunchtime.  Last A1c 5.2 on 06/05/2018. -Check A1c -Customize SSI to 1-5 units -Continue home statin.  Hypertension: Normotensive -Continue home atenolol, hydralazine, nifedipine and diuretics.  OSA -Nightly CPAP  Morbid obesity: BMI 42 -Encourage lifestyle change to lose weight  Tobacco use disorder: Continues to smoke about half a pack a day.  Likes to quit smoking.  Has tried Chantix but couldn't tolerate. -Declined nicotine patch here  Headache in patient with history of migraine -Tylenol   Scheduled Meds: . aspirin  81 mg Oral Daily  . atenolol  100 mg Oral Daily  . atorvastatin  80 mg Oral Daily  . clopidogrel  75 mg Oral Daily  . escitalopram  20 mg Oral Daily  . fenofibrate  160 mg Oral Daily  . fluticasone  1 spray Each Nare Daily  . furosemide  40 mg Oral q1800  . furosemide  80 mg Oral QAC breakfast  . heparin  5,000 Units Subcutaneous Q8H  . hydrALAZINE  50 mg Oral TID  . insulin  aspart  0-9 Units Subcutaneous TID WC  . multivitamin with minerals  1 tablet Oral Daily  . NIFEdipine  90 mg Oral Daily  . omega-3 acid ethyl esters  1 capsule Oral TID  . [START ON 09/13/2018] pneumococcal 23 valent vaccine  0.5 mL Intramuscular Tomorrow-1000  . sodium chloride flush  3 mL Intravenous Once  . spironolactone  25 mg Oral Daily  . vitamin B-12  500 mcg  Oral Daily   Continuous Infusions: . [START ON 09/14/2018] ceFEPime (MAXIPIME) IV    . vancomycin    . [START ON 09/14/2018] vancomycin     PRN Meds:.acetaminophen **OR** acetaminophen, sodium chloride   DVT prophylaxis: Subcu heparin Code Status: Full code Family Communication: Patient to let me know if family has questions. Disposition Plan: Will remain inpatient.  Patient with undifferentiated sepsis and significant fever.  Patient is immunocompromised with ESRD, DM-2, CAD, morbid obesity and tobacco use.  Very high risk for deconditioning and significant adverse outcome.  We will start broad-spectrum antibiotics pending cultures and clinical improvement.  Consultants:   Nephrology  Procedures:   None  Microbiology: . COVID-19 negative . Blood cultures pending  Antimicrobials: Anti-infectives (From admission, onward)   Start     Dose/Rate Route Frequency Ordered Stop   09/14/18 1800  ceFEPIme (MAXIPIME) 2 g in sodium chloride 0.9 % 100 mL IVPB     2 g 200 mL/hr over 30 Minutes Intravenous Every T-Th-Sa (1800) 09/12/18 1036     09/14/18 1200  vancomycin (VANCOCIN) IVPB 1000 mg/200 mL premix     1,000 mg 200 mL/hr over 60 Minutes Intravenous Every T-Th-Sa (Hemodialysis) 09/12/18 1037     09/12/18 1145  vancomycin (VANCOCIN) 2,500 mg in sodium chloride 0.9 % 500 mL IVPB     2,500 mg 250 mL/hr over 120 Minutes Intravenous  Once 09/12/18 1035     09/12/18 1130  ceFEPIme (MAXIPIME) 2 g in sodium chloride 0.9 % 100 mL IVPB     2 g 200 mL/hr over 30 Minutes Intravenous NOW 09/12/18 1035 09/12/18 1141       Objective: Vitals:   09/12/18 0351 09/12/18 0412 09/12/18 0700 09/12/18 1115  BP: (!) 110/44 121/60    Pulse: 86 97    Resp: 15     Temp:  99.5 F (37.5 C)  (!) 101.7 F (38.7 C)  TempSrc:  Oral  Oral  SpO2: 94% 95%    Weight:   (!) 140.3 kg   Height:   6' (1.829 m)    No intake or output data in the 24 hours ending 09/12/18 1235 Filed Weights   09/11/18 2009  09/12/18 0700  Weight: (!) 137.4 kg (!) 140.3 kg    Examination:  GENERAL: No acute distress.  Appears well.  HEENT: MMM.  Vision and hearing grossly intact.  NECK: Supple.  Difficult to assess JVD due to body habitus. LUNGS:  No IWOB. Good air movement bilaterally. HEART:  RRR. Heart sounds normal.  ABD: Bowel sounds present. Soft. Non tender.  MSK/EXT:  Moves all extremities. No apparent deformity. No edema bilaterally.  SKIN: Few small skin ascribes over left lower extremity.  Appears clean and dry. NEURO: Awake, alert and oriented appropriately.  No gross deficit.  PSYCH: Calm. Normal affect.    Data Reviewed: I have independently reviewed following labs and imaging studies  CBC: Recent Labs  Lab 09/11/18 2037 09/12/18 0634  WBC 9.6 9.4  NEUTROABS 8.2* 8.1*  HGB 9.9* 9.3*  HCT 30.9* 28.6*  MCV 107.7* 106.7*  PLT 144* 505*   Basic Metabolic Panel: Recent Labs  Lab 09/11/18 2037 09/12/18 0634  NA 141 141  K 4.0 3.9  CL 98 98  CO2 28 27  GLUCOSE 99 90  BUN 53* 61*  CREATININE 12.25* 14.07*  CALCIUM 7.7* 7.7*   GFR: Estimated Creatinine Clearance: 8.7 mL/min (A) (by C-G formula based on SCr of 14.07 mg/dL (H)). Liver Function Tests: Recent Labs  Lab 09/11/18 2037 09/12/18 0634  AST 19 16  ALT 19 18  ALKPHOS 26* 23*  BILITOT 0.8 0.8  PROT 6.5 6.0*  ALBUMIN 3.0* 2.8*   No results for input(s): LIPASE, AMYLASE in the last 168 hours. No results for input(s): AMMONIA in the last 168 hours. Coagulation Profile: Recent Labs  Lab 09/11/18 2037  INR 1.3*   Cardiac Enzymes: No results for input(s): CKTOTAL, CKMB, CKMBINDEX, TROPONINI in the last 168 hours. BNP (last 3 results) No results for input(s): PROBNP in the last 8760 hours. HbA1C: No results for input(s): HGBA1C in the last 72 hours. CBG: Recent Labs  Lab 09/12/18 0416 09/12/18 0451 09/12/18 0552 09/12/18 0706 09/12/18 0822  GLUCAP 52* 66* 82 101* 92   Lipid Profile: No results for  input(s): CHOL, HDL, LDLCALC, TRIG, CHOLHDL, LDLDIRECT in the last 72 hours. Thyroid Function Tests: No results for input(s): TSH, T4TOTAL, FREET4, T3FREE, THYROIDAB in the last 72 hours. Anemia Panel: No results for input(s): VITAMINB12, FOLATE, FERRITIN, TIBC, IRON, RETICCTPCT in the last 72 hours. Urine analysis: No results found for: COLORURINE, APPEARANCEUR, LABSPEC, PHURINE, GLUCOSEU, HGBUR, BILIRUBINUR, KETONESUR, PROTEINUR, UROBILINOGEN, NITRITE, LEUKOCYTESUR Sepsis Labs: Invalid input(s): PROCALCITONIN, LACTICIDVEN  Recent Results (from the past 240 hour(s))  SARS Coronavirus 2     Status: None   Collection Time: 09/12/18  1:16 AM  Result Value Ref Range Status   SARS Coronavirus 2 NOT DETECTED NOT DETECTED Final    Comment: (NOTE) SARS-CoV-2 target nucleic acids are NOT DETECTED. The SARS-CoV-2 RNA is generally detectable in upper and lower respiratory specimens during the acute phase of infection.  Negative  results do not preclude SARS-CoV-2 infection, do not rule out co-infections with other pathogens, and should not be used as the sole basis for treatment or other patient management decisions.  Negative results must be combined with clinical observations, patient history, and epidemiological information. The expected result is Not Detected. Fact Sheet for Patients: http://www.biofiredefense.com/wp-content/uploads/2020/03/BIOFIRE-COVID -19-patients.pdf Fact Sheet for Healthcare Providers: http://www.biofiredefense.com/wp-content/uploads/2020/03/BIOFIRE-COVID -19-hcp.pdf This test is not yet approved or cleared by the [place] and  has been authorized for detection and/or diagnosis of SARS-CoV-2 by FDA under an Emergency Use Authorization (EUA).  This EUA will remain in effec t (meaning this test can be used) for the duration of  the COVID-19 declaration under Section 564(b)(1) of the Act, 21 U.S.C. section 360bbb-3(b)(1), unless the authorization is  terminated or revoked sooner. Performed at [facility], [place] [address], [place], [place] [address]   MRSA PCR Screening     Status: None   Collection Time: 09/12/18  6:47 AM   Specimen: Nasal Mucosa; Nasopharyngeal  Result Value Ref Range Status   MRSA by PCR NEGATIVE NEGATIVE Final    Comment:        The GeneXpert MRSA Assay (FDA approved for NASAL specimens only), is one component of a comprehensive MRSA colonization surveillance program. It is not intended to diagnose MRSA infection nor to guide or monitor treatment for MRSA infections. Performed at [facility], [address] [place], [place] [address]       Radiology Studies: Dg Chest 2 View  Result Date: 09/11/2018 CLINICAL DATA:  Suspected sepsis. Fever for 2 days. EXAM: CHEST - 2 VIEW COMPARISON:  None. FINDINGS: Upper normal heart size with normal mediastinal contours. Mild hyperinflation and bronchial thickening. Pulmonary vasculature is normal. No confluent consolidation, pleural effusion, or pneumothorax. Multilevel degenerative change throughout spine. Widening of the right acromioclavicular joint is likely postsurgical. No acute osseous abnormalities are seen. IMPRESSION: Mild hyperinflation and bronchial thickening suggesting bronchitis, asthma, or smoking related lung disease. No consolidation to suggest pneumonia. Electronically Signed   By: Keith Rake M.D.   On: 09/11/2018 21:24     T. [facility] Pager [phone]  If 7PM-7AM, please contact night-coverage [email or web address] Password [facility] 09/12/2018, 12:35 PM

## 2018-09-12 NOTE — Progress Notes (Signed)
Paged by RN asking for transfer to 2W out of concern for aerosolization as patient will be on CPAP at night.  Patient has initial COVID test which was negative.  Repeat COVID test was ordered as send out by admitting provider which is pending.  He has fever and cough (unchanged from baseline) but no oxygen requirement.  I reviewed patient's CT chest without contrast which shows right lower lobe pneumonia.  Formal read is pending.  I suspect patient has bacterial pneumonia versus COVID and should remain in his current care area.  I have discussed this with patient's nurse.

## 2018-09-12 NOTE — H&P (Signed)
History and Physical    Earl Middleton. ZCH:[id number] DOB: 23-Dec-1965 DOA: 09/11/2018  PCP: Marzetta Board, DO  Patient coming from: Home.  Chief Complaint: Fever and chills.  HPI: Earl Landi. is a 53 y.o. male with history of ESRD on hemodialysis on Tuesday Thursday Saturday, CAD status post PCI, hypertension, diabetes, anemia, sleep apnea presents to the ER with complaint of fever and chills last 2 days.  Denies any chest pain shortness of breath nausea vomiting diarrhea.  Has chronic cough and also has chronic headaches.  Denies any neck pain or neck rigidity.  Denies any recent travel or sick contacts.  No skin rash or any inflammation around the AV fistula site.  ED Course: In the ER patient was tachycardic febrile with temperature of 102 F mildly tachypneic initially.  Chest x-ray shows chronic bronchitis picture.  Blood cultures were obtained call with test was negative.  Empiric antibiotic started.  Patient's other blood tests hemoglobin appears to be at baseline.  Platelets 144.  INR 1.3.  Patient admitted for SIRS.  Review of Systems: As per HPI, rest all negative.   Past Medical History:  Diagnosis Date  . Arthritis   . Degenerative disc disease, lumbar   . Diabetes ([place])    type 2  . GERD (gastroesophageal reflux disease)   . Heart disease   . Heartburn   . Hemorrhoid   . HLD (hyperlipidemia)   . HTN (hypertension)   . Hydrocele   . Sleep apnea     Past Surgical History:  Procedure Laterality Date  . CARPAL TUNNEL RELEASE  2006  . COLONOSCOPY  1992  . CORONARY ANGIOPLASTY WITH STENT PLACEMENT  2005  . POLYPECTOMY  1993   throat  . ROTATOR CUFF REPAIR  2004     reports that he has been smoking. He has a 0.50 pack-year smoking history. He has never used smokeless tobacco. He reports current alcohol use. He reports that he does not use drugs.  No Known Allergies  Family History  Problem Relation Age of Onset  . Kidney disease Father   . Prostate  cancer Neg Hx     Prior to Admission medications   Medication Sig Start Date End Date Taking? Authorizing Provider  aspirin 81 MG chewable tablet Chew 81 mg by mouth daily.    Yes [provider]  atenolol (TENORMIN) 100 MG tablet Take 100 mg by mouth daily. Reported on 06/01/2015   Yes [provider]  atorvastatin (LIPITOR) 80 MG tablet Take 80 mg by mouth daily.    Yes [provider]  clopidogrel (PLAVIX) 75 MG tablet Take 75 mg by mouth daily.    Yes [provider]  escitalopram (LEXAPRO) 20 MG tablet Take 20 mg by mouth.   Yes [provider]  fenofibrate 160 MG tablet Take 160 mg by mouth daily. Reported on 06/01/2015   Yes [provider]  furosemide (LASIX) 20 MG tablet Take 40-80 mg by mouth 2 (two) times daily. 80 mg in the morning and 40 mg in the afternoon   Yes [provider]  glipiZIDE (GLUCOTROL XL) 5 MG 24 hr tablet Take 5 mg by mouth daily. Reported on 06/01/2015 11/21/14  Yes [provider]  hydrALAZINE (APRESOLINE) 50 MG tablet Take 50 mg by mouth 3 (three) times daily.    Yes [provider]  ibuprofen (ADVIL,MOTRIN) 800 MG tablet Take 800 mg by mouth every 6 (six) hours as needed for moderate  pain.  04/12/15  Yes [provider]  Multiple Vitamin (MULTI-VITAMINS) TABS Take 1 tablet by mouth daily.    Yes [provider]  NIFEdipine (ADALAT CC) 90 MG 24 hr tablet Take 90 mg by mouth daily.    Yes [provider]  Omega-3 1000 MG CAPS Take 1 capsule by mouth 3 (three) times daily.    Yes [provider]  spironolactone (ALDACTONE) 25 MG tablet Take 25 mg by mouth daily.  05/05/15  Yes [provider]  vitamin B-12 (CYANOCOBALAMIN) 500 MCG tablet Take 500 mcg by mouth daily. Reported on 06/01/2015   Yes [provider]    Physical Exam: Vitals:   09/12/18 0130 09/12/18 0338 09/12/18 0351 09/12/18 0412  BP: (!) 109/58  (!) 110/44 121/60  Pulse: (!)  106  86 97  Resp: 20  15   Temp:  (!) 100.4 F (38 C)  99.5 F (37.5 C)  TempSrc:  Oral  Oral  SpO2: 94%  94% 95%  Weight:      Height:          Constitutional: Moderately built and nourished. Vitals:   09/12/18 0130 09/12/18 0338 09/12/18 0351 09/12/18 0412  BP: (!) 109/58  (!) 110/44 121/60  Pulse: (!) 106  86 97  Resp: 20  15   Temp:  (!) 100.4 F (38 C)  99.5 F (37.5 C)  TempSrc:  Oral  Oral  SpO2: 94%  94% 95%  Weight:      Height:       Eyes: Anicteric no pallor. ENMT: No discharge from the ears eyes nose and mouth. Neck: No mass felt.  No neck rigidity.  No JVD appreciated. Respiratory: No rhonchi or crepitations. Cardiovascular: S1-S2 heard. Abdomen: Soft nontender bowel sounds present. Musculoskeletal: No edema. Skin: No rash. Neurologic: Alert awake oriented to time place and person.  Moves all extremities. Psychiatric: Appears normal per normal affect.   Labs on Admission: I have personally reviewed following labs and imaging studies  CBC: Recent Labs  Lab 09/11/18 2037  WBC 9.6  NEUTROABS 8.2*  HGB 9.9*  HCT 30.9*  MCV 107.7*  PLT 644*   Basic Metabolic Panel: Recent Labs  Lab 09/11/18 2037  NA 141  K 4.0  CL 98  CO2 28  GLUCOSE 99  BUN 53*  CREATININE 12.25*  CALCIUM 7.7*   GFR: Estimated Creatinine Clearance: 9.9 mL/min (A) (by C-G formula based on SCr of 12.25 mg/dL (H)). Liver Function Tests: Recent Labs  Lab 09/11/18 2037  AST 19  ALT 19  ALKPHOS 26*  BILITOT 0.8  PROT 6.5  ALBUMIN 3.0*   No results for input(s): LIPASE, AMYLASE in the last 168 hours. No results for input(s): AMMONIA in the last 168 hours. Coagulation Profile: Recent Labs  Lab 09/11/18 2037  INR 1.3*   Cardiac Enzymes: No results for input(s): CKTOTAL, CKMB, CKMBINDEX, TROPONINI in the last 168 hours. BNP (last 3 results) No results for input(s): PROBNP in the last 8760 hours. HbA1C: No results for input(s): HGBA1C in the last 72 hours. CBG:  Recent Labs  Lab 09/12/18 0451  GLUCAP 66*   Lipid Profile: No results for input(s): CHOL, HDL, LDLCALC, TRIG, CHOLHDL, LDLDIRECT in the last 72 hours. Thyroid Function Tests: No results for input(s): TSH, T4TOTAL, FREET4, T3FREE, THYROIDAB in the last 72 hours. Anemia Panel: No results for input(s): VITAMINB12, FOLATE, FERRITIN, TIBC, IRON, RETICCTPCT in the last 72 hours. Urine analysis: No results found for: COLORURINE, APPEARANCEUR,  LABSPEC, PHURINE, GLUCOSEU, HGBUR, BILIRUBINUR, KETONESUR, PROTEINUR, UROBILINOGEN, NITRITE, LEUKOCYTESUR Sepsis Labs: @LABRCNTIP (procalcitonin:4,lacticidven:4) ) Recent Results (from the past 240 hour(s))  SARS Coronavirus 2     Status: None   Collection Time: 09/12/18  1:16 AM  Result Value Ref Range Status   SARS Coronavirus 2 NOT DETECTED NOT DETECTED Final    Comment: (NOTE) SARS-CoV-2 target nucleic acids are NOT DETECTED. The SARS-CoV-2 RNA is generally detectable in upper and lower respiratory specimens during the acute phase of infection.  Negative  results do not preclude SARS-CoV-2 infection, do not rule out co-infections with other pathogens, and should not be used as the sole basis for treatment or other patient management decisions.  Negative results must be combined with clinical observations, patient history, and epidemiological information. The expected result is Not Detected. Fact Sheet for Patients: http://www.biofiredefense.com/wp-content/uploads/2020/03/BIOFIRE-COVID -19-patients.pdf Fact Sheet for Healthcare Providers: http://www.biofiredefense.com/wp-content/uploads/2020/03/BIOFIRE-COVID -19-hcp.pdf This test is not yet approved or cleared by the [place] and  has been authorized for detection and/or diagnosis of SARS-CoV-2 by FDA under an Emergency Use Authorization (EUA).  This EUA will remain in effec t (meaning this test can be used) for the duration of  the COVID-19 declaration under Section 564(b)(1) of  the Act, 21 U.S.C. section 360bbb-3(b)(1), unless the authorization is terminated or revoked sooner. Performed at [facility], [place] [address], [place], [place] [address]      Radiological Exams on Admission: Dg Chest 2 View  Result Date: 09/11/2018 CLINICAL DATA:  Suspected sepsis. Fever for 2 days. EXAM: CHEST - 2 VIEW COMPARISON:  None. FINDINGS: Upper normal heart size with normal mediastinal contours. Mild hyperinflation and bronchial thickening. Pulmonary vasculature is normal. No confluent consolidation, pleural effusion, or pneumothorax. Multilevel degenerative change throughout spine. Widening of the right acromioclavicular joint is likely postsurgical. No acute osseous abnormalities are seen. IMPRESSION: Mild hyperinflation and bronchial thickening suggesting bronchitis, asthma, or smoking related lung disease. No consolidation to suggest pneumonia. Electronically Signed   By: Keith Rake M.D.   On: 09/11/2018 21:24      Assessment/Plan Principal Problem:   SIRS (systemic inflammatory response syndrome) ([facility]) Active Problems:   ESRD (end stage renal disease) ([place])   Essential hypertension   DM (diabetes mellitus), type 2 with renal complications ([facility])   Anemia associated with chronic renal failure    1. SIRS -source not clear.  Since there is COVID-19 pandemic going on we will get a repeat COVID-19 tests as sent out.  Follow cultures.  Keep patient on empiric antibiotics. 2. ESRD on hemodialysis on Tuesday Thursday and Saturday appears euvolemic.  Consult nephrology for dialysis.  Patient is on spironolactone and Lasix. 3. CAD status post stenting denies any chest pain.  We will continue antiplatelet agents statins beta-blocker. 4. Hypertension continue beta-blocker calcium channel blocker hydralazine and patient is also on spironolactone. 5. Anemia appears to be chronic likely from ESRD.  Compared to labs with care everywhere.  Follow CBC. 6. Diabetes mellitus  type 2 takes glipizide.  Patient is mildly hyperglycemic.  Has been fed sandwich and we will recheck CBGs closely. 7. Chronic headaches could be migrainous sinus related. 8. Mild thrombocytopenia -follow CBC and blood cultures.  If there is any further drop in platelets will need further work-up.   DVT prophylaxis: Heparin. Code Status: Full code. Family Communication: Discussed with patient. Disposition Plan: Home. Consults called: None. Admission status: Observation.   Rise Patience MD [facility] Pager [phone].  If 7PM-7AM, please contact night-coverage [email or web address]  Password [email or web address]  09/12/2018, 5:18 AM

## 2018-09-13 DIAGNOSIS — R6510 Systemic inflammatory response syndrome (SIRS) of non-infectious origin without acute organ dysfunction: Secondary | ICD-10-CM

## 2018-09-13 LAB — CBC
HCT: 27.4 % — ABNORMAL LOW (ref 39.0–52.0)
Hemoglobin: 9.3 g/dL — ABNORMAL LOW (ref 13.0–17.0)
MCH: 35.2 pg — ABNORMAL HIGH (ref 26.0–34.0)
MCHC: 33.9 g/dL (ref 30.0–36.0)
MCV: 103.8 fL — ABNORMAL HIGH (ref 80.0–100.0)
Platelets: 147 10*3/uL — ABNORMAL LOW (ref 150–400)
RBC: 2.64 MIL/uL — ABNORMAL LOW (ref 4.22–5.81)
RDW: 15 % (ref 11.5–15.5)
WBC: 9.3 10*3/uL (ref 4.0–10.5)
nRBC: 0.2 % (ref 0.0–0.2)

## 2018-09-13 LAB — GLUCOSE, CAPILLARY
Glucose-Capillary: 105 mg/dL — ABNORMAL HIGH (ref 70–99)
Glucose-Capillary: 123 mg/dL — ABNORMAL HIGH (ref 70–99)
Glucose-Capillary: 116 mg/dL — ABNORMAL HIGH (ref 70–99)
Glucose-Capillary: 147 mg/dL — ABNORMAL HIGH (ref 70–99)

## 2018-09-13 LAB — MAGNESIUM: Magnesium: 1.8 mg/dL (ref 1.7–2.4)

## 2018-09-13 MED ORDER — PENTAFLUOROPROP-TETRAFLUOROETH EX AERO: 1.0000 "application " | INHALATION_SPRAY | CUTANEOUS | Status: DC | PRN

## 2018-09-13 MED ORDER — HEPARIN SODIUM (PORCINE) 1000 UNIT/ML DIALYSIS: 1000.0000 [IU] | INTRAMUSCULAR | Status: DC | PRN

## 2018-09-13 MED ORDER — SODIUM CHLORIDE 0.9 % IV SOLN: 100.0000 mL | INTRAVENOUS | Status: DC | PRN

## 2018-09-13 MED ORDER — LIDOCAINE HCL (PF) 1 % IJ SOLN: 5.0000 mL | INTRAMUSCULAR | Status: DC | PRN

## 2018-09-13 MED ORDER — ALTEPLASE 2 MG IJ SOLR: 2.0000 mg | Freq: Once | INTRAMUSCULAR | Status: DC | PRN

## 2018-09-13 MED ORDER — LIDOCAINE-PRILOCAINE 2.5-2.5 % EX CREA: 1.0000 "application " | TOPICAL_CREAM | CUTANEOUS | Status: DC | PRN

## 2018-09-13 MED ORDER — VANCOMYCIN HCL IN DEXTROSE 1-5 GM/200ML-% IV SOLN
INTRAVENOUS | Status: AC
  Administered 2018-09-13: 1000 mg via INTRAVENOUS
  Filled 2018-09-13: qty 200

## 2018-09-13 MED ORDER — ACETAMINOPHEN 325 MG PO TABS
ORAL_TABLET | ORAL | Status: AC
  Filled 2018-09-13: qty 2

## 2018-09-13 MED ORDER — HEPARIN SODIUM (PORCINE) 1000 UNIT/ML DIALYSIS
4500.0000 [IU] | Freq: Once | INTRAMUSCULAR | Status: AC
  Administered 2018-09-12: 4500 [IU] via INTRAVENOUS_CENTRAL

## 2018-09-13 NOTE — Progress Notes (Addendum)
  [facility] Progress Note   Assessment/ Plan:   Dialysis Orders: Center: [place]  on TTS . EDW 136 kg HD Bath 2K/2.25Ca  Time 4:45 Heparin 3,000 unit bolus 1500 units mid treatment. Access LAVF BFR 450 DFR 800 micera 75 mcg IVP every 4 weeks (given 09/10/18)  Assessment/Plan: 1.  Fevers/SIRS- CT chest with RLL Pneumonia 6/18.  Initial covid-19 negative but repeated for sendout and pending.  RVP pending.  Blood cultures NGTD.  No sores or ulcerations noted.  On vanc/ cefepime (6/18-) 2.  ESRD -  TTS, received on sched, next HD for tomorrow 6/20. 3.  Hypertension/volume  - stable 4.  Anemia  - stable 5.  Metabolic bone disease -   Stable. On auryxia 420 mg qac 6.  Nutrition - renal diet, carbohydrate modified 7. Vascular access- access flows have been dropping. Will follow while he remains here but may require fistulogram.  Subjective:    HD yesterday.  Still having fevers.  On vanc/ cefepime.  Send-out COVID pending.  BS 43 this AM.    Objective:   BP 124/71   Pulse 88   Temp 99.1 F (37.3 C) (Oral)   Resp 16   Ht 6' (1.829 m)   Wt (!) 136.8 kg   SpO2 91%   BMI 40.90 kg/m   Physical Exam: Exam deferred d/t pending COVID testing in an effort to preserve PPE and minimize exposure  Labs: BMET Recent Labs  Lab 09/11/18 2037 09/12/18 0634 09/13/18 0532  NA 141 141 137  K 4.0 3.9 3.9  CL 98 98 97*  CO2 28 27 24   GLUCOSE 99 90 88  BUN 53* 61* 33*  CREATININE 12.25* 14.07* 9.35*  CALCIUM 7.7* 7.7* 8.4*  PHOS  --   --  2.9   CBC Recent Labs  Lab 09/11/18 2037 09/12/18 0634 09/13/18 0007  WBC 9.6 9.4 9.3  NEUTROABS 8.2* 8.1*  --   HGB 9.9* 9.3* 9.3*  HCT 30.9* 28.6* 27.4*  MCV 107.7* 106.7* 103.8*  PLT 144* 128* 147*    @IMGRELPRIORS @ Medications:    . aspirin  81 mg Oral Daily  . atenolol  100 mg Oral Daily  . atorvastatin  80 mg Oral Daily  . Chlorhexidine Gluconate Cloth  6 each Topical Q0600  . clopidogrel  75 mg Oral Daily   . escitalopram  20 mg Oral Daily  . fenofibrate  160 mg Oral Daily  . fluticasone  1 spray Each Nare Daily  . furosemide  40 mg Oral q1800  . furosemide  80 mg Oral QAC breakfast  . guaiFENesin  600 mg Oral BID  . heparin  5,000 Units Subcutaneous Q8H  . hydrALAZINE  50 mg Oral TID  . insulin aspart  0-6 Units Subcutaneous TID WC  . multivitamin with minerals  1 tablet Oral Daily  . NIFEdipine  90 mg Oral Daily  . omega-3 acid ethyl esters  1 capsule Oral TID  . pneumococcal 23 valent vaccine  0.5 mL Intramuscular Tomorrow-1000  . sodium chloride flush  3 mL Intravenous Once  . spironolactone  25 mg Oral Daily  . vitamin B-12  500 mcg Oral Daily    Earl Lips, MD 09/13/2018, 11:11 AM

## 2018-09-13 NOTE — Progress Notes (Signed)
PROGRESS NOTE  Earl Middleton. PIR:[id number] DOB: 1965/12/08 DOA: 09/11/2018 PCP: Marzetta Board, DO   LOS: 1 day   Brief narrative: 53 year old male with history of ESRD on HD TTS, CAD s/p PCI, HTN, DM-2, tobacco use disorder, anemia, OSA and morbid obesity presenting with fever and chills for the last 2 days.  Review of system otherwise negative except for chronic cough unchanged from baseline. No URI symptoms.  In ED, febrile to 103 F, tachycardic and mildly tachypneic.  CXR with chronic bronchitis picture.  COVID-19 negative.  CBC and BMP appears to be at baseline.  Lactic acid negative.  Patient was admitted for SIRS.  Subjective: Patient was seen and examined this morning.  Middle-aged Caucasian male.  Morbidly obese.  Lying down in bed.  With CPAP on. Chart reviewed Hypoglycemic 43 last night Persistently febrile, T-max 102 this morning at 3 AM, WBC count normal at 9.3 Blood pressure sitting in a wide range from 97/52 to 175/79 Dialysis dependent Hemoglobin 9.3 Repeat COVID-19 test pending CT chest 6/18 showed airspace opacity posteriorly in the right lower lobe with air bronchograms most compatible with pneumonia.  Assessment/Plan:  Principal Problem:   SIRS (systemic inflammatory response syndrome) (HCC) Active Problems:   ESRD (end stage renal disease) ([place])   Essential hypertension   DM (diabetes mellitus), type 2 with renal complications (HCC)   Anemia associated with chronic renal failure  Sepsis due to right lower lobe pneumonia  -Continues to have fever.  Normal WBC count.  COVID-19 test negative.  -Trend WBC count and temperature.  Lactic acid level normal.   -Patient is immunocompromised due to ESRD, DM-2, CAD, morbid obesity and tobacco use. -Continue IV vancomycin and cefepime per pharmacy -Follow cultures  Chronic cough: CXR with mild hyperinflation, bronchitis and possible asthma/COPD.  Patient is a smoker.  Currently smokes about half a pack a day.  -Continue scheduled and PRN DuoNeb -Antibiotic as above -We will escalate to systemic steroid or ICS if no improvement  ESRD on HD TTS/BMD: No hypercalcemia or uremia. -Nephrology, Dr. Hollie Salk consulted  Anemia of chronic disease: Hemoglobin 9.3.  Baseline about 10-12. -Per nephrology  History of CAD status post stent: Stable.  No anginal symptoms. -Continue home atenolol, atorvastatin, Plavix and aspirin.  Controlled NIDDM-2 with renal complication and hypoglycemia: - On glipizide at home.  Blood clots: 49 last night.  Hemoglobin A1c 5.2 in March 2020. -Continue SSI of 1-5 units -Continue home statin.  Hypertension: Normotensive -Continue home atenolol, hydralazine, nifedipine and diuretics.  Tobacco use disorder: Continues to smoke about half a pack a day.  Likes to quit smoking.  Has tried Chantix but couldn't tolerate. -Declined nicotine patch here  Headache in patient with history of migraine -Tylenol   Morbid obesity - Body mass index is 40.9 kg/m. Patient has been advised to make an attempt to improve diet and exercise patterns to aid in weight loss.  OSA -Nightly CPAP  Body mass index is 40.9 kg/m. Mobility: Encourage ambulation Diet: Cardiac diet DVT prophylaxis:  Heparin subcu Code Status:   Code Status: Full Code  Family Communication:  Expected Discharge:  2 to 3 days of inpatient stay  Consultants:  Nephrology  Procedures:  None  Antimicrobials:  Anti-infectives (From admission, onward)   Start     Dose/Rate Route Frequency Ordered Stop   09/14/18 1800  ceFEPIme (MAXIPIME) 2 g in sodium chloride 0.9 % 100 mL IVPB  Status:  Discontinued     2 g 200 mL/hr over  30 Minutes Intravenous Every T-Th-Sa (1800) 09/12/18 1036 09/12/18 1449   09/14/18 1200  vancomycin (VANCOCIN) IVPB 1000 mg/200 mL premix  Status:  Discontinued     1,000 mg 200 mL/hr over 60 Minutes Intravenous Every T-Th-Sa (Hemodialysis) 09/12/18 1037 09/12/18 1449   09/12/18 2200   ceFEPIme (MAXIPIME) 2 g in sodium chloride 0.9 % 100 mL IVPB     2 g 200 mL/hr over 30 Minutes Intravenous Every T-Th-Sa (1800) 09/12/18 1449     09/12/18 2000  vancomycin (VANCOCIN) IVPB 1000 mg/200 mL premix     1,000 mg 200 mL/hr over 60 Minutes Intravenous Every T-Th-Sa (Hemodialysis) 09/12/18 1449     09/12/18 1530  doxycycline (VIBRAMYCIN) 100 mg in sodium chloride 0.9 % 250 mL IVPB  Status:  Discontinued     100 mg 125 mL/hr over 120 Minutes Intravenous Every 12 hours 09/12/18 1426 09/13/18 1101   09/12/18 1145  vancomycin (VANCOCIN) 2,500 mg in sodium chloride 0.9 % 500 mL IVPB     2,500 mg 250 mL/hr over 120 Minutes Intravenous  Once 09/12/18 1035 09/12/18 1450   09/12/18 1130  ceFEPIme (MAXIPIME) 2 g in sodium chloride 0.9 % 100 mL IVPB     2 g 200 mL/hr over 30 Minutes Intravenous NOW 09/12/18 1035 09/12/18 1141      Infusions:  . ceFEPime (MAXIPIME) IV 2 g (09/13/18 0433)  . vancomycin Stopped (09/13/18 0450)    Scheduled Meds: . aspirin  81 mg Oral Daily  . atenolol  100 mg Oral Daily  . atorvastatin  80 mg Oral Daily  . Chlorhexidine Gluconate Cloth  6 each Topical Q0600  . clopidogrel  75 mg Oral Daily  . escitalopram  20 mg Oral Daily  . fenofibrate  160 mg Oral Daily  . fluticasone  1 spray Each Nare Daily  . furosemide  40 mg Oral q1800  . furosemide  80 mg Oral QAC breakfast  . guaiFENesin  600 mg Oral BID  . heparin  5,000 Units Subcutaneous Q8H  . hydrALAZINE  50 mg Oral TID  . insulin aspart  0-6 Units Subcutaneous TID WC  . multivitamin with minerals  1 tablet Oral Daily  . NIFEdipine  90 mg Oral Daily  . omega-3 acid ethyl esters  1 capsule Oral TID  . pneumococcal 23 valent vaccine  0.5 mL Intramuscular Tomorrow-1000  . sodium chloride flush  3 mL Intravenous Once  . spironolactone  25 mg Oral Daily  . vitamin B-12  500 mcg Oral Daily    PRN meds: acetaminophen **OR** acetaminophen, sodium chloride   Objective: Vitals:   09/13/18 1243  09/13/18 1443  BP:  124/72  Pulse:  78  Resp:  19  Temp: 99.2 F (37.3 C) 99.6 F (37.6 C)  SpO2:  98%    Intake/Output Summary (Last 24 hours) at 09/13/2018 1629 Last data filed at 09/13/2018 0610 Gross per 24 hour  Intake 550 ml  Output 4293 ml  Net -3743 ml   Filed Weights   09/12/18 0700 09/12/18 2248 09/13/18 0300  Weight: (!) 140.3 kg (!) 141 kg (!) 136.8 kg   Weight change: 3.56 kg Body mass index is 40.9 kg/m.   Physical Exam: General exam: Appears calm and comfortable.  Morbidly obese, lying down in bed Skin: No rashes, lesions or ulcers. HEENT: Atraumatic, normocephalic, supple neck, no obvious bleeding Lungs: Clear to auscultation bilaterally CVS: Regular rate and rhythm, no murmur GI/Abd soft, nontender, distended from obesity, bowel sound present CNS:  Alert, awake, oriented X 3 Psychiatry: Mood appropriate Extremities: No pedal edema, no calf tenderness  Data Review: I have personally reviewed the laboratory data and studies available.  Recent Labs  Lab 09/11/18 2037 09/12/18 0634 09/13/18 0007  WBC 9.6 9.4 9.3  NEUTROABS 8.2* 8.1*  --   HGB 9.9* 9.3* 9.3*  HCT 30.9* 28.6* 27.4*  MCV 107.7* 106.7* 103.8*  PLT 144* 128* 147*   Recent Labs  Lab 09/11/18 2037 09/12/18 0634 09/13/18 0532  NA 141 141 137  K 4.0 3.9 3.9  CL 98 98 97*  CO2 28 27 24   GLUCOSE 99 90 88  BUN 53* 61* 33*  CREATININE 12.25* 14.07* 9.35*  CALCIUM 7.7* 7.7* 8.4*  MG  --   --  1.8  PHOS  --   --  2.9    Terrilee Croak, MD  [facility] 09/13/2018

## 2018-09-14 LAB — MAGNESIUM: Magnesium: 2.2 mg/dL (ref 1.7–2.4)

## 2018-09-14 LAB — GLUCOSE, CAPILLARY
Glucose-Capillary: 170 mg/dL — ABNORMAL HIGH (ref 70–99)
Glucose-Capillary: 113 mg/dL — ABNORMAL HIGH (ref 70–99)
Glucose-Capillary: 147 mg/dL — ABNORMAL HIGH (ref 70–99)

## 2018-09-14 LAB — NOVEL CORONAVIRUS, NAA (HOSP ORDER, SEND-OUT TO REF LAB; TAT 18-24 HRS): SARS-CoV-2, NAA: NOT DETECTED

## 2018-09-14 LAB — RENAL FUNCTION PANEL
Albumin: 3 g/dL — ABNORMAL LOW (ref 3.5–5.0)
Albumin: 2.6 g/dL — ABNORMAL LOW (ref 3.5–5.0)
Anion gap: 16 — ABNORMAL HIGH (ref 5–15)
Anion gap: 16 — ABNORMAL HIGH (ref 5–15)
BUN: 33 mg/dL — ABNORMAL HIGH (ref 6–20)
BUN: 57 mg/dL — ABNORMAL HIGH (ref 6–20)
CO2: 24 mmol/L (ref 22–32)
CO2: 24 mmol/L (ref 22–32)
Calcium: 8.4 mg/dL — ABNORMAL LOW (ref 8.9–10.3)
Calcium: 8.8 mg/dL — ABNORMAL LOW (ref 8.9–10.3)
Chloride: 97 mmol/L — ABNORMAL LOW (ref 98–111)
Chloride: 98 mmol/L (ref 98–111)
Creatinine, Ser: 9.35 mg/dL — ABNORMAL HIGH (ref 0.61–1.24)
Creatinine, Ser: 13.16 mg/dL — ABNORMAL HIGH (ref 0.61–1.24)
GFR calc Af Amer: 7 mL/min — ABNORMAL LOW (ref >60)
GFR calc Af Amer: 4 mL/min — ABNORMAL LOW (ref >60)
GFR calc non Af Amer: 6 mL/min — ABNORMAL LOW (ref >60)
GFR calc non Af Amer: 4 mL/min — ABNORMAL LOW (ref >60)
Glucose, Bld: 88 mg/dL (ref 70–99)
Glucose, Bld: 94 mg/dL (ref 70–99)
Phosphorus: 2.9 mg/dL (ref 2.5–4.6)
Phosphorus: 8 mg/dL — ABNORMAL HIGH (ref 2.5–4.6)
Potassium: 3.9 mmol/L (ref 3.5–5.1)
Potassium: 4.9 mmol/L (ref 3.5–5.1)
Sodium: 137 mmol/L (ref 135–145)
Sodium: 138 mmol/L (ref 135–145)

## 2018-09-14 MED ORDER — HEPARIN SODIUM (PORCINE) 1000 UNIT/ML IJ SOLN
INTRAMUSCULAR | Status: AC
  Filled 2018-09-14: qty 1

## 2018-09-14 MED ORDER — VANCOMYCIN HCL IN DEXTROSE 1-5 GM/200ML-% IV SOLN
INTRAVENOUS | Status: AC
  Administered 2018-09-14: 1000 mg via INTRAVENOUS
  Filled 2018-09-14: qty 200

## 2018-09-14 NOTE — Progress Notes (Signed)
Patient has home CPAP that he places on/off himself. RT informed patient to have RT called if assistance is needed. RT will monitor as needed.

## 2018-09-14 NOTE — Progress Notes (Signed)
  [facility] Progress Note   Assessment/ Plan:   Dialysis Orders: Center: [place]  on TTS . EDW 136 kg HD Bath 2K/2.25Ca  Time 4:45 Heparin 3,000 unit bolus 1500 units mid treatment. Access LAVF BFR 450 DFR 800 micera 75 mcg IVP every 4 weeks (given 09/10/18)  Assessment/Plan: 1.  Fevers/SIRS- CT chest with RLL Pneumonia 6/18.  Initial covid-19 negative but repeated for sendout and pending.  RVP negative.  Blood cultures NGTD.  No sores or ulcerations noted.  On vanc/ cefepime (6/18-) 2.  ESRD -  TTS, received on sched, next HD for tomorrow 6/20. 3.  Hypertension/volume  - stable 4.  Anemia  - stable 5.  Metabolic bone disease -   Stable. On auryxia 420 mg qac 6.  Nutrition - renal diet, carbohydrate modified 7. Vascular access- access flows have been dropping. Will follow while he remains here but may require fistulogram. 8. Dispo: pending improvement  Subjective:    Reports feeling much better.  Sendout COVID test still pending.  At EDW this AM   Objective:   BP 129/71 (BP Location: Right Arm)   Pulse 84   Temp 99.8 F (37.7 C) (Oral)   Resp 16   Ht 6' (1.829 m)   Wt (!) 136.8 kg   SpO2 100%   BMI 40.90 kg/m   Physical Exam: GEN NAD, lying in bed PULM normal WOB EXT no gross edema NEURO" AAO x 3  Labs: [employer] Recent Labs  Lab 09/11/18 2037 09/12/18 0634 09/13/18 0532 09/14/18 0411  NA 141 141 137 138  K 4.0 3.9 3.9 4.9  CL 98 98 97* 98  CO2 28 27 24 24   GLUCOSE 99 90 88 94  BUN 53* 61* 33* 57*  CREATININE 12.25* 14.07* 9.35* 13.16*  CALCIUM 7.7* 7.7* 8.4* 8.8*  PHOS  --   --  2.9 8.0*   CBC Recent Labs  Lab 09/11/18 2037 09/12/18 0634 09/13/18 0007  WBC 9.6 9.4 9.3  NEUTROABS 8.2* 8.1*  --   HGB 9.9* 9.3* 9.3*  HCT 30.9* 28.6* 27.4*  MCV 107.7* 106.7* 103.8*  PLT 144* 128* 147*    @IMGRELPRIORS @ Medications:    . heparin      . aspirin  81 mg Oral Daily  . atenolol  100 mg Oral Daily  . atorvastatin  80 mg Oral Daily   . Chlorhexidine Gluconate Cloth  6 each Topical Q0600  . clopidogrel  75 mg Oral Daily  . escitalopram  20 mg Oral Daily  . fenofibrate  160 mg Oral Daily  . fluticasone  1 spray Each Nare Daily  . furosemide  40 mg Oral q1800  . furosemide  80 mg Oral QAC breakfast  . guaiFENesin  600 mg Oral BID  . heparin  5,000 Units Subcutaneous Q8H  . hydrALAZINE  50 mg Oral TID  . insulin aspart  0-6 Units Subcutaneous TID WC  . multivitamin with minerals  1 tablet Oral Daily  . NIFEdipine  90 mg Oral Daily  . omega-3 acid ethyl esters  1 capsule Oral TID  . pneumococcal 23 valent vaccine  0.5 mL Intramuscular Tomorrow-1000  . sodium chloride flush  3 mL Intravenous Once  . spironolactone  25 mg Oral Daily  . vitamin B-12  500 mcg Oral Daily     Madelon Lips, MD [facility] pgr [phone] 09/14/2018, 9:03 AM

## 2018-09-14 NOTE — Progress Notes (Signed)
PROGRESS NOTE  Earl Middleton. QMG:[id number] DOB: March 15, 1966 DOA: 09/11/2018 PCP: Marzetta Board, DO   LOS: 2 days   Brief narrative: 53 year old male with history of ESRD on HD TTS, CAD s/p PCI, HTN, DM-2, tobacco use disorder, anemia,OSA and morbid obesity presenting with fever and chills for the last 2 days. Review of system otherwise negative except for chronic cough unchanged from baseline. No URI symptoms.  In ED, febrile to 103 F, tachycardic and mildly tachypneic. CXR with chronic bronchitis picture.COVID-19 negative. CBC and BMP appears to be at baseline. Lactic acid negative. Patient was admitted for SIRS. CT chest 6/18 showed airspace opacity posteriorly in the right lower lobe with air bronchograms most compatible with pneumonia.  Subjective: Patient was seen and examined this afternoon.  Pleasant middle-aged morbidly obese male.  Lying down in bed.  Not in distress.  Feels better.  In last 24 hours, he had an elevated temperature 101 last night.  No other fever episode.  Assessment/Plan:  Principal Problem:   SIRS (systemic inflammatory response syndrome) (HCC) Active Problems:   ESRD (end stage renal disease) ([place])   Essential hypertension   DM (diabetes mellitus), type 2 with renal complications (HCC)   Anemia associated with chronic renal failure  Sepsis due to right lower lobe pneumonia  -Fever trending down.  WBC count normal.  COVID-19 test negative.  -Trend WBC count and temperature.  Lactic acid level normal.   -Patient is immunocompromised due to ESRD, DM-2, CAD,morbid obesity and tobacco use. -Continue IV vancomycin and cefepime per pharmacy -Follow cultures. -If no recurrence of fever next 24 hours, plan to discharge on oral antibiotics.  Chronic cough:CXR with mild hyperinflation, bronchitis and possible asthma/COPD. Patient is a smoker. Currently smokes about half a pack a day. -Continue scheduled and PRN DuoNeb -Antibiotic as above -We  will escalate to systemic steroid or ICS if no improvement  ESRD on HD TTS/BMD:No hypercalcemia or uremia. -Nephrology, Dr. Balinda Quails.  Underwent dialysis today.  Anemia of chronic disease: Hemoglobin 9.3. Baseline about 10-12. -Per nephrology  History of CAD status post stent: Stable. No anginal symptoms. -Continue home atenolol, atorvastatin, Plavix and aspirin.  ControlledNIDDM-2 with renal complicationand hypoglycemia: -On glipizide at home.  Blood glucose level controlled under 200. Hemoglobin A1c 5.2 in March 2020. -Continue SSI of 1-5 units -Continue home statin.  Hypertension: Normotensive -Continue home atenolol, hydralazine, nifedipine and diuretics.  Tobacco use disorder: Continues to smoke about half a pack a day. Likes to quit smoking. Has tried Chantix but couldn't tolerate. -Declined nicotine patch here  Headache in patient with history of migraine -Tylenol  Morbid obesity - Body mass index is 40.9 kg/m. Patient has been advised to make an attempt to improve diet and exercise patterns to aid in weight loss.  OSA -Nightly CPAP  Body mass index is 40.9 kg/m. Mobility: Encourage ambulation Diet: Cardiac diet DVT prophylaxis: Heparin subcu Code Status:  Code Status: Full Code  Family Communication: Expected Discharge:  Anticipate discharge to home tomorrow if no recurrence of fever   Consultants:  Nephrology  Procedures:  None  Antimicrobials:  Anti-infectives (From admission, onward)   Start     Dose/Rate Route Frequency Ordered Stop   09/14/18 1800  ceFEPIme (MAXIPIME) 2 g in sodium chloride 0.9 % 100 mL IVPB  Status:  Discontinued     2 g 200 mL/hr over 30 Minutes Intravenous Every T-Th-Sa (1800) 09/12/18 1036 09/12/18 1449   09/14/18 1200  vancomycin (VANCOCIN) IVPB 1000 mg/200 mL premix  Status:  Discontinued     1,000 mg 200 mL/hr over 60 Minutes Intravenous Every T-Th-Sa (Hemodialysis) 09/12/18 1037 09/12/18 1449    09/12/18 2200  ceFEPIme (MAXIPIME) 2 g in sodium chloride 0.9 % 100 mL IVPB     2 g 200 mL/hr over 30 Minutes Intravenous Every T-Th-Sa (1800) 09/12/18 1449     09/12/18 2000  vancomycin (VANCOCIN) IVPB 1000 mg/200 mL premix     1,000 mg 200 mL/hr over 60 Minutes Intravenous Every T-Th-Sa (Hemodialysis) 09/12/18 1449     09/12/18 1530  doxycycline (VIBRAMYCIN) 100 mg in sodium chloride 0.9 % 250 mL IVPB  Status:  Discontinued     100 mg 125 mL/hr over 120 Minutes Intravenous Every 12 hours 09/12/18 1426 09/13/18 1101   09/12/18 1145  vancomycin (VANCOCIN) 2,500 mg in sodium chloride 0.9 % 500 mL IVPB     2,500 mg 250 mL/hr over 120 Minutes Intravenous  Once 09/12/18 1035 09/12/18 1450   09/12/18 1130  ceFEPIme (MAXIPIME) 2 g in sodium chloride 0.9 % 100 mL IVPB     2 g 200 mL/hr over 30 Minutes Intravenous NOW 09/12/18 1035 09/12/18 1141      Infusions:  . ceFEPime (MAXIPIME) IV 2 g (09/13/18 0433)  . vancomycin 1,000 mg (09/14/18 1156)    Scheduled Meds: . aspirin  81 mg Oral Daily  . atenolol  100 mg Oral Daily  . atorvastatin  80 mg Oral Daily  . Chlorhexidine Gluconate Cloth  6 each Topical Q0600  . clopidogrel  75 mg Oral Daily  . escitalopram  20 mg Oral Daily  . fenofibrate  160 mg Oral Daily  . fluticasone  1 spray Each Nare Daily  . furosemide  40 mg Oral q1800  . furosemide  80 mg Oral QAC breakfast  . guaiFENesin  600 mg Oral BID  . heparin      . heparin  5,000 Units Subcutaneous Q8H  . hydrALAZINE  50 mg Oral TID  . insulin aspart  0-6 Units Subcutaneous TID WC  . multivitamin with minerals  1 tablet Oral Daily  . NIFEdipine  90 mg Oral Daily  . omega-3 acid ethyl esters  1 capsule Oral TID  . pneumococcal 23 valent vaccine  0.5 mL Intramuscular Tomorrow-1000  . sodium chloride flush  3 mL Intravenous Once  . spironolactone  25 mg Oral Daily  . vitamin B-12  500 mcg Oral Daily    PRN meds: acetaminophen **OR** acetaminophen, sodium chloride    Objective: Vitals:   09/14/18 1350 09/14/18 1350  BP: 119/66 119/66  Pulse: 72 70  Resp: 18 18  Temp: 99.2 F (37.3 C) 99.2 F (37.3 C)  SpO2: 95% 97%   No intake or output data in the 24 hours ending 09/14/18 1554 Filed Weights   09/13/18 0300 09/14/18 0758 09/14/18 1245  Weight: (!) 136.8 kg 136 kg 133.8 kg   Weight change:  Body mass index is 40.01 kg/m.   Physical Exam: General exam: Appears calm and comfortable.  Morbidly obese Skin: No rashes, lesions or ulcers. HEENT: Atraumatic, normocephalic, supple neck, no obvious bleeding Lungs: Clear to auscultation bilaterally CVS: Regular rate and rhythm, no murmur GI/Abd soft, nontender, nondistended, bowel sound present CNS: Alert, awake, oriented x3 Psychiatry: Mood appropriate Extremities: No pedal edema, no calf tenderness  Data Review: I have personally reviewed the laboratory data and studies available.  Recent Labs  Lab 09/11/18 2037 09/12/18 0634 09/13/18 0007  WBC 9.6 9.4 9.3  NEUTROABS  8.2* 8.1*  --   HGB 9.9* 9.3* 9.3*  HCT 30.9* 28.6* 27.4*  MCV 107.7* 106.7* 103.8*  PLT 144* 128* 147*   Recent Labs  Lab 09/11/18 2037 09/12/18 0634 09/13/18 0532 09/14/18 0411  NA 141 141 137 138  K 4.0 3.9 3.9 4.9  CL 98 98 97* 98  CO2 28 27 24 24   GLUCOSE 99 90 88 94  BUN 53* 61* 33* 57*  CREATININE 12.25* 14.07* 9.35* 13.16*  CALCIUM 7.7* 7.7* 8.4* 8.8*  MG  --   --  1.8 2.2  PHOS  --   --  2.9 8.0*    Terrilee Croak, MD  [facility] 09/14/2018

## 2018-09-15 LAB — RENAL FUNCTION PANEL
Albumin: 2.6 g/dL — ABNORMAL LOW (ref 3.5–5.0)
Anion gap: 16 — ABNORMAL HIGH (ref 5–15)
BUN: 42 mg/dL — ABNORMAL HIGH (ref 6–20)
CO2: 25 mmol/L (ref 22–32)
Calcium: 8.6 mg/dL — ABNORMAL LOW (ref 8.9–10.3)
Chloride: 95 mmol/L — ABNORMAL LOW (ref 98–111)
Creatinine, Ser: 9.72 mg/dL — ABNORMAL HIGH (ref 0.61–1.24)
GFR calc Af Amer: 6 mL/min — ABNORMAL LOW (ref >60)
GFR calc non Af Amer: 5 mL/min — ABNORMAL LOW (ref >60)
Glucose, Bld: 109 mg/dL — ABNORMAL HIGH (ref 70–99)
Phosphorus: 7.3 mg/dL — ABNORMAL HIGH (ref 2.5–4.6)
Potassium: 4 mmol/L (ref 3.5–5.1)
Sodium: 136 mmol/L (ref 135–145)

## 2018-09-15 LAB — GLUCOSE, CAPILLARY: Glucose-Capillary: 105 mg/dL — ABNORMAL HIGH (ref 70–99)

## 2018-09-15 LAB — MAGNESIUM: Magnesium: 2.2 mg/dL (ref 1.7–2.4)

## 2018-09-15 MED ORDER — AMOXICILLIN-POT CLAVULANATE 500-125 MG PO TABS: 1.0000 | ORAL_TABLET | Freq: Every day | ORAL | 0 refills | Status: AC

## 2018-09-15 MED ORDER — FERRIC CITRATE 1 GM 210 MG(FE) PO TABS: 420.0000 mg | ORAL_TABLET | Freq: Three times a day (TID) | ORAL | Status: DC

## 2018-09-15 NOTE — Discharge Summary (Signed)
Physician Discharge Summary  Earl Middleton. KWI:[id number] DOB: 06-09-1965 DOA: 09/11/2018  PCP: Marzetta Board, DO  Admit date: 09/11/2018 Discharge date: 09/15/2018  Admitted From: Home Discharge disposition: Home   Code Status: Full Code   Recommendations for Outpatient Follow-Up:   1. Complete the course of antibiotics.  Follow-up on dialysis.  Discharge Diagnosis:   Principal Problem:   SIRS (systemic inflammatory response syndrome) (HCC) Active Problems:   ESRD (end stage renal disease) ([place])   Essential hypertension   DM (diabetes mellitus), type 2 with renal complications (HCC)   Anemia associated with chronic renal failure    History of Present Illness / Brief narrative:  53 year old male with history of ESRD on HD TTS, CAD s/p PCI, HTN, DM-2, tobacco use disorder, anemia,OSA and morbid obesity presenting with fever and chills for the last 2 days. Review of system otherwise negative except for chronic cough unchanged from baseline. No URI symptoms.  In ED, febrile to 103 F, tachycardic and mildly tachypneic. CXR with chronic bronchitis picture. COVID-19 negative. CBC and BMP appears to be at baseline. Lactic acid negative.  Patient was initially admitted for SIRS.  However, CT chest 6/18showed airspace opacity posteriorly in the right lower lobe with air bronchograms most compatible with pneumonia. He was treated for sepsis secondary to right lower lobe pneumonia  Hospital Course:  Sepsis due to right lower lobe pneumonia -Sepsis present on admission.  Pneumonia per CT scan of chest. -Started on broad-spectrum antibiotic with IV cefepime and IV vancomycin. -Continue to spike fever for first 24 hours, fever trended down.  No fever in last 24 hours. -WBC count normal.  COVID-19 test negative.  -Patient is immunocompromised due to ESRD, DM-2, CAD,morbid obesity and tobacco use. -No growth in blood culture obtained on admission. -Okay to discharge home  today on oral Augmentin for next 7 days, renally dose to 500 mg daily because of ESRD status.  Chronic cough: CXR with mild hyperinflation, bronchitis and possible asthma/COPD. Patient is a smoker. Currently smokes about half a pack a day.  Counseled to quit smoking. -Continuebronchodilators at home. -Antibiotic as above  ESRD on HD TTS/BMD: No hypercalcemia or uremia. -Nephrology, Dr. Balinda Quails.  Underwent dialysis on the schedule.  Anemia of chronic disease:  Hemoglobin 9.3. Baseline about 10-12.  History of CAD status post stent:  Stable. No anginal symptoms. -Continue home atenolol, atorvastatin, Plavix and aspirin.  ControlledNIDDM-2 with renal complicationand hypoglycemia: -On glipizide at home.Blood glucose level controlled under 200.Hemoglobin A1c 5.2 in March 2020. -Continue glipizide.  Continue statin.  Hypertension:  -Blood pressure controlled on home medicines atenolol, hydralazine, nifedipine and diuretics.  Tobacco use disorder:  Continues to smoke about half a pack a day. Likes to quit smoking. Has tried Chantix but couldn't tolerate. -Declined nicotine patch here  Headache in patient with history of migraine -Tylenol  Morbid obesity -Body mass index is 40.9 kg/m.Patient has been advised to make an attempt to improve diet and exercise patterns to aid in weight loss.  OSA -Nightly CPAP  Stable for discharge to home today. Subjective:  Patient seen and examined this morning.  Sitting up in chair.  Not in distress.  No fever.  Not on oxygen supplementation.  Feels ready to go home.  Discharge Exam:   Vitals:   09/14/18 1939 09/14/18 2130 09/15/18 0543 09/15/18 1029  BP:  115/77 (!) 136/57 127/82  Pulse: 76 65 63 67  Resp: 18 18 16    Temp:  98.9 F (37.2 C) 97.8  F (36.6 C)   TempSrc:  Oral Oral   SpO2: 98% 97% 100% 97%  Weight:      Height:        Body mass index is 40.01 kg/m.  General exam: Appears calm and  comfortable.  Morbidly obese Skin: No rashes, lesions or ulcers. HEENT: Atraumatic, normocephalic, supple neck, no obvious bleeding Lungs: Clear to auscultate bilaterally CVS: Regular rate and rhythm, no murmur GI/Abd soft, distended from obesity, nontender, bowel sound present CNS: Alert, awake, oriented x3 Psychiatry: Mood appropriate Extremities: No pedal edema, no calf tenderness  Discharge Instructions:  Wound care: None Discharge Instructions    Diet - low sodium heart healthy   Complete by: As directed    Increase activity slowly   Complete by: As directed       Allergies as of 09/15/2018   No Known Allergies     Medication List    TAKE these medications   amoxicillin-clavulanate 500-125 MG tablet Commonly known as: Augmentin Take 1 tablet (500 mg total) by mouth daily for 7 days.   aspirin 81 MG chewable tablet Chew 81 mg by mouth daily.   atenolol 100 MG tablet Commonly known as: TENORMIN Take 100 mg by mouth daily. Reported on 06/01/2015   atorvastatin 80 MG tablet Commonly known as: LIPITOR Take 80 mg by mouth daily.   clopidogrel 75 MG tablet Commonly known as: PLAVIX Take 75 mg by mouth daily.   escitalopram 20 MG tablet Commonly known as: LEXAPRO Take 20 mg by mouth.   fenofibrate 160 MG tablet Take 160 mg by mouth daily. Reported on 06/01/2015   furosemide 20 MG tablet Commonly known as: LASIX Take 40-80 mg by mouth 2 (two) times daily. 80 mg in the morning and 40 mg in the afternoon   glipiZIDE 5 MG 24 hr tablet Commonly known as: GLUCOTROL XL Take 5 mg by mouth daily. Reported on 06/01/2015   hydrALAZINE 50 MG tablet Commonly known as: APRESOLINE Take 50 mg by mouth 3 (three) times daily.   ibuprofen 800 MG tablet Commonly known as: ADVIL Take 800 mg by mouth every 6 (six) hours as needed for moderate pain.   Multi-Vitamins Tabs Take 1 tablet by mouth daily.   NIFEdipine 90 MG 24 hr tablet Commonly known as: ADALAT CC Take 90 mg by  mouth daily.   Omega-3 1000 MG Caps Take 1 capsule by mouth 3 (three) times daily.   spironolactone 25 MG tablet Commonly known as: ALDACTONE Take 25 mg by mouth daily.   vitamin B-12 500 MCG tablet Commonly known as: CYANOCOBALAMIN Take 500 mcg by mouth daily. Reported on 06/01/2015       Time coordinating discharge: 35 minutes  The results of significant diagnostics from this hospitalization (including imaging, microbiology, ancillary and laboratory) are listed below for reference.    Procedures and Diagnostic Studies:   Dg Chest 2 View  Result Date: 09/11/2018 CLINICAL DATA:  Suspected sepsis. Fever for 2 days. EXAM: CHEST - 2 VIEW COMPARISON:  None. FINDINGS: Upper normal heart size with normal mediastinal contours. Mild hyperinflation and bronchial thickening. Pulmonary vasculature is normal. No confluent consolidation, pleural effusion, or pneumothorax. Multilevel degenerative change throughout spine. Widening of the right acromioclavicular joint is likely postsurgical. No acute osseous abnormalities are seen. IMPRESSION: Mild hyperinflation and bronchial thickening suggesting bronchitis, asthma, or smoking related lung disease. No consolidation to suggest pneumonia. Electronically Signed   By: Keith Rake M.D.   On: 09/11/2018 21:24   Ct Chest  Wo Contrast  Result Date: 09/12/2018 CLINICAL DATA:  Fever, chills, cough EXAM: CT CHEST WITHOUT CONTRAST TECHNIQUE: Multidetector CT imaging of the chest was performed following the standard protocol without IV contrast. COMPARISON:  09/11/2018 FINDINGS: Cardiovascular: Heart is normal size. Coronary artery calcifications diffusely. Scattered aortic calcifications. No aneurysm. Mediastinum/Nodes: No mediastinal, hilar, or axillary adenopathy. Lungs/Pleura: Consolidation noted posteriorly in the right lower lobe compatible with pneumonia. No effusions. Left lung clear. Upper Abdomen: Imaging into the upper abdomen shows no acute findings.  Musculoskeletal: Chest wall soft tissues are unremarkable. No acute bony abnormality. IMPRESSION: Airspace opacity posteriorly in the right lower lobe with air bronchograms most compatible with pneumonia. Diffuse coronary artery disease. Aortic Atherosclerosis (ICD10-I70.0). Electronically Signed   By: Rolm Baptise M.D.   On: 09/12/2018 19:53     Labs:   Basic Metabolic Panel: Recent Labs  Lab 09/11/18 2037 09/12/18 0634 09/13/18 0532 09/14/18 0411 09/15/18 0302  NA 141 141 137 138 136  K 4.0 3.9 3.9 4.9 4.0  CL 98 98 97* 98 95*  CO2 28 27 24 24 25   GLUCOSE 99 90 88 94 109*  BUN 53* 61* 33* 57* 42*  CREATININE 12.25* 14.07* 9.35* 13.16* 9.72*  CALCIUM 7.7* 7.7* 8.4* 8.8* 8.6*  MG  --   --  1.8 2.2 2.2  PHOS  --   --  2.9 8.0* 7.3*   GFR Estimated Creatinine Clearance: 12.3 mL/min (A) (by C-G formula based on SCr of 9.72 mg/dL (H)). Liver Function Tests: Recent Labs  Lab 09/11/18 2037 09/12/18 0634 09/13/18 0532 09/14/18 0411 09/15/18 0302  AST 19 16  --   --   --   ALT 19 18  --   --   --   ALKPHOS 26* 23*  --   --   --   BILITOT 0.8 0.8  --   --   --   PROT 6.5 6.0*  --   --   --   ALBUMIN 3.0* 2.8* 3.0* 2.6* 2.6*   No results for input(s): LIPASE, AMYLASE in the last 168 hours. No results for input(s): AMMONIA in the last 168 hours. Coagulation profile Recent Labs  Lab 09/11/18 2037  INR 1.3*    CBC: Recent Labs  Lab 09/11/18 2037 09/12/18 0634 09/13/18 0007  WBC 9.6 9.4 9.3  NEUTROABS 8.2* 8.1*  --   HGB 9.9* 9.3* 9.3*  HCT 30.9* 28.6* 27.4*  MCV 107.7* 106.7* 103.8*  PLT 144* 128* 147*   Cardiac Enzymes: No results for input(s): CKTOTAL, CKMB, CKMBINDEX, TROPONINI in the last 168 hours. BNP: Invalid input(s): POCBNP CBG: Recent Labs  Lab 09/13/18 2210 09/14/18 1352 09/14/18 1704 09/14/18 2131 09/15/18 0807  GLUCAP 147* 170* 113* 147* 105*   D-Dimer No results for input(s): DDIMER in the last 72 hours. Hgb A1c No results for  input(s): HGBA1C in the last 72 hours. Lipid Profile No results for input(s): CHOL, HDL, LDLCALC, TRIG, CHOLHDL, LDLDIRECT in the last 72 hours. Thyroid function studies No results for input(s): TSH, T4TOTAL, T3FREE, THYROIDAB in the last 72 hours.  Invalid input(s): FREET3 Anemia work up No results for input(s): VITAMINB12, FOLATE, FERRITIN, TIBC, IRON, RETICCTPCT in the last 72 hours. Microbiology Recent Results (from the past 240 hour(s))  Culture, blood (Routine x 2)     Status: None (Preliminary result)   Collection Time: 09/11/18  8:15 PM   Specimen: BLOOD  Result Value Ref Range Status   Specimen Description BLOOD RIGHT ARM  Final  Special Requests   Final    BOTTLES DRAWN AEROBIC AND ANAEROBIC Blood Culture results may not be optimal due to an excessive volume of blood received in culture bottles   Culture   Final    NO GROWTH 3 DAYS Performed at [facility], [place] [address] [place], [place] [address]    Report Status PENDING  Incomplete  Culture, blood (Routine x 2)     Status: None (Preliminary result)   Collection Time: 09/11/18  8:34 PM   Specimen: BLOOD  Result Value Ref Range Status   Specimen Description BLOOD RIGHT ARM  Final   Special Requests   Final    BOTTLES DRAWN AEROBIC ONLY Blood Culture results may not be optimal due to an excessive volume of blood received in culture bottles   Culture   Final    NO GROWTH 3 DAYS Performed at [facility], [place] [address], [place], [place] [address]    Report Status PENDING  Incomplete  SARS Coronavirus 2     Status: None   Collection Time: 09/12/18  1:16 AM  Result Value Ref Range Status   SARS Coronavirus 2 NOT DETECTED NOT DETECTED Final    Comment: (NOTE) SARS-CoV-2 target nucleic acids are NOT DETECTED. The SARS-CoV-2 RNA is generally detectable in upper and lower respiratory specimens during the acute phase of infection.  Negative  results do not preclude SARS-CoV-2 infection, do not rule out  co-infections with other pathogens, and should not be used as the sole basis for treatment or other patient management decisions.  Negative results must be combined with clinical observations, patient history, and epidemiological information. The expected result is Not Detected. Fact Sheet for Patients: http://www.biofiredefense.com/wp-content/uploads/2020/03/BIOFIRE-COVID -19-patients.pdf Fact Sheet for Healthcare Providers: http://www.biofiredefense.com/wp-content/uploads/2020/03/BIOFIRE-COVID -19-hcp.pdf This test is not yet approved or cleared by the [place] and  has been authorized for detection and/or diagnosis of SARS-CoV-2 by FDA under an Emergency Use Authorization (EUA).  This EUA will remain in effec t (meaning this test can be used) for the duration of  the COVID-19 declaration under Section 564(b)(1) of the Act, 21 U.S.C. section 360bbb-3(b)(1), unless the authorization is terminated or revoked sooner. Performed at [facility], [place] [address] [place], [place] [address]   MRSA PCR Screening     Status: None   Collection Time: 09/12/18  6:47 AM   Specimen: Nasal Mucosa; Nasopharyngeal  Result Value Ref Range Status   MRSA by PCR NEGATIVE NEGATIVE Final    Comment:        The GeneXpert MRSA Assay (FDA approved for NASAL specimens only), is one component of a comprehensive MRSA colonization surveillance program. It is not intended to diagnose MRSA infection nor to guide or monitor treatment for MRSA infections. Performed at [facility], [place] [address], [place], [place] [address]   Novel Coronavirus,NAA,(SEND-OUT TO REF LAB - TAT 24-48 hrs); Hosp Order     Status: None   Collection Time: 09/12/18  4:28 PM   Specimen: Nasopharyngeal Swab; Respiratory  Result Value Ref Range Status   SARS-CoV-2, NAA NOT DETECTED NOT DETECTED Final    Comment: (NOTE) This test was developed and its performance characteristics determined by [employer]. This test has not been FDA cleared or approved. This test has been authorized by FDA under an Emergency Use Authorization (EUA). This test is only authorized for the duration of time the declaration that circumstances exist justifying the authorization of the emergency use of in  vitro diagnostic tests for detection of SARS-CoV-2 virus and/or diagnosis of COVID-19 infection under section 564(b)(1) of the Act, 21 U.S.C. 591MBW-4(Y)(6), unless the authorization is terminated or revoked sooner. When diagnostic testing is negative, the possibility of a false negative result should be considered in the context of a patient's recent exposures and the presence of clinical signs and symptoms consistent with COVID-19. An individual without symptoms of COVID-19 and who is not shedding SARS-CoV-2 virus would expect to have a negative (not detected) result in this assay. Performed  At: [facility] [address] [place], [place] [id number] Rush Farmer MD BL:[id number]    [place]  Final    Comment: Performed at [facility], [place] [address], [place], [place] [address]  Respiratory Panel by PCR     Status: None   Collection Time: 09/12/18  4:29 PM   Specimen: Nasopharyngeal Swab; Respiratory  Result Value Ref Range Status   Adenovirus NOT DETECTED NOT DETECTED Final   Coronavirus 229E NOT DETECTED NOT DETECTED Final    Comment: (NOTE) The Coronavirus on the Respiratory Panel, DOES NOT test for the novel  Coronavirus (2019 nCoV)    Coronavirus HKU1 NOT DETECTED NOT DETECTED Final   Coronavirus NL63 NOT DETECTED NOT DETECTED Final   Coronavirus OC43 NOT DETECTED NOT DETECTED Final   Metapneumovirus NOT DETECTED NOT DETECTED Final   Rhinovirus / Enterovirus NOT DETECTED NOT DETECTED Final   Influenza A NOT DETECTED NOT DETECTED Final   Influenza B NOT DETECTED NOT DETECTED Final   Parainfluenza Virus 1 NOT DETECTED NOT DETECTED Final    Parainfluenza Virus 2 NOT DETECTED NOT DETECTED Final   Parainfluenza Virus 3 NOT DETECTED NOT DETECTED Final   Parainfluenza Virus 4 NOT DETECTED NOT DETECTED Final   Respiratory Syncytial Virus NOT DETECTED NOT DETECTED Final   Bordetella pertussis NOT DETECTED NOT DETECTED Final   Chlamydophila pneumoniae NOT DETECTED NOT DETECTED Final   Mycoplasma pneumoniae NOT DETECTED NOT DETECTED Final    Comment: Performed at [facility], [place]. [address] [place], [place] [address]    Signed: Terrilee Croak  [facility] 09/15/2018, 1:55 PM

## 2018-09-15 NOTE — Progress Notes (Signed)
Earl Middleton. to be D/C'd home per MD order. Discussed with the patient and all questions fully answered.   VVS, Skin clean, dry and intact without evidence of skin break down, no evidence of skin tears noted.  IV catheter discontinued intact. Site without signs and symptoms of complications. Dressing and pressure applied.  An After Visit Summary was printed and given to the patient.  Patient escorted via [place], and D/C home via private auto.  Earl Middleton  09/15/2018 11:53 AM

## 2018-09-15 NOTE — Progress Notes (Signed)
[facility] Progress Note   Assessment/ Plan:   Dialysis Orders: Center: [place]  on TTS . EDW 136 kg HD Bath 2K/2.25Ca  Time 4:45 Heparin 3,000 unit bolus 1500 units mid treatment. Access LAVF BFR 450 DFR 800 micera 75 mcg IVP every 4 weeks (given 09/10/18)  Assessment/Plan: 1.  Fevers/SIRS- CT chest with RLL Pneumonia 6/18.  Initial covid-19 negative, sendout COVID NAA also negative.  RVP negative.  Blood cultures NGTD.  No sores or ulcerations noted.  On vanc/ cefepime (6/18-).  Per primary 2.  ESRD -  TTS, received on sched, s/p HD 6/20. Below EDW, would rec d/c weight be 134 kg. 3.  Hypertension/volume as above 4.  Anemia  - stable 5.  Metabolic bone disease -   Stable. On auryxia 420 mg qac 6.  Nutrition - renal diet, carbohydrate modified 7. Vascular access- access flows have been dropping. Pt reports he has OP fgram 6/26--> should be OK to go to but advised him to call and ask tomorrow 8. PFT appointment: supposed to have PFTs tomorrow at [facility].  Have advised that he cancel this appt. 9. Dispo: Ok from renal perspective to go today.  Subjective:    S/p HD yesterday and doing well.     Objective:   BP (!) 136/57 (BP Location: Right Arm)   Pulse 63   Temp 97.8 F (36.6 C) (Oral)   Resp 16   Ht 6' (1.829 m)   Wt 133.8 kg Comment: stood to scale   SpO2 100%   BMI 40.01 kg/m   Physical Exam: GEN NAD, lying in bed PULM normal WOB, clear bilaterally CV: RRR EXT no gross edema NEURO" AAO x 3 ACCESS: L AVF + T/B  Labs: BMET Recent Labs  Lab 09/11/18 2037 09/12/18 0634 09/13/18 0532 09/14/18 0411 09/15/18 0302  NA 141 141 137 138 136  K 4.0 3.9 3.9 4.9 4.0  CL 98 98 97* 98 95*  CO2 28 27 24 24 25   GLUCOSE 99 90 88 94 109*  BUN 53* 61* 33* 57* 42*  CREATININE 12.25* 14.07* 9.35* 13.16* 9.72*  CALCIUM 7.7* 7.7* 8.4* 8.8* 8.6*  PHOS  --   --  2.9 8.0* 7.3*   CBC Recent Labs  Lab 09/11/18 2037 09/12/18 0634 09/13/18 0007  WBC 9.6  9.4 9.3  NEUTROABS 8.2* 8.1*  --   HGB 9.9* 9.3* 9.3*  HCT 30.9* 28.6* 27.4*  MCV 107.7* 106.7* 103.8*  PLT 144* 128* 147*    @IMGRELPRIORS @ Medications:    . aspirin  81 mg Oral Daily  . atenolol  100 mg Oral Daily  . atorvastatin  80 mg Oral Daily  . Chlorhexidine Gluconate Cloth  6 each Topical Q0600  . clopidogrel  75 mg Oral Daily  . escitalopram  20 mg Oral Daily  . fenofibrate  160 mg Oral Daily  . ferric citrate  420 mg Oral TID WC  . fluticasone  1 spray Each Nare Daily  . furosemide  40 mg Oral q1800  . furosemide  80 mg Oral QAC breakfast  . guaiFENesin  600 mg Oral BID  . heparin  5,000 Units Subcutaneous Q8H  . hydrALAZINE  50 mg Oral TID  . insulin aspart  0-6 Units Subcutaneous TID WC  . multivitamin with minerals  1 tablet Oral Daily  . NIFEdipine  90 mg Oral Daily  . omega-3 acid ethyl esters  1 capsule Oral TID  . pneumococcal 23 valent vaccine  0.5  mL Intramuscular Tomorrow-1000  . sodium chloride flush  3 mL Intravenous Once  . spironolactone  25 mg Oral Daily  . vitamin B-12  500 mcg Oral Daily     Madelon Lips, MD [facility] pgr [phone] 09/15/2018, 10:27 AM

## 2018-09-16 LAB — CULTURE, BLOOD (ROUTINE X 2)
Culture: NO GROWTH
Culture: NO GROWTH

## 2020-01-08 ENCOUNTER — Emergency Department (HOSPITAL_COMMUNITY)
Admission: EM | Admit: 2020-01-08 | Discharge: 2020-01-09 | Disposition: A | Discharge status: Home / Self Care | Payer: Medicare Other | Attending: Emergency Medicine | Admitting: Emergency Medicine

## 2020-01-08 ENCOUNTER — Encounter (HOSPITAL_COMMUNITY): Payer: Self-pay

## 2020-01-08 ENCOUNTER — Emergency Department (HOSPITAL_COMMUNITY): Payer: Medicare Other

## 2020-01-08 DIAGNOSIS — Z7984 Long term (current) use of oral hypoglycemic drugs: Secondary | ICD-10-CM | POA: Insufficient documentation

## 2020-01-08 DIAGNOSIS — E1122 Type 2 diabetes mellitus with diabetic chronic kidney disease: Secondary | ICD-10-CM | POA: Insufficient documentation

## 2020-01-08 DIAGNOSIS — Z992 Dependence on renal dialysis: Secondary | ICD-10-CM | POA: Insufficient documentation

## 2020-01-08 DIAGNOSIS — N186 End stage renal disease: Secondary | ICD-10-CM | POA: Insufficient documentation

## 2020-01-08 DIAGNOSIS — Z7982 Long term (current) use of aspirin: Secondary | ICD-10-CM | POA: Diagnosis not present

## 2020-01-08 DIAGNOSIS — Z79899 Other long term (current) drug therapy: Secondary | ICD-10-CM | POA: Diagnosis not present

## 2020-01-08 DIAGNOSIS — I120 Hypertensive chronic kidney disease with stage 5 chronic kidney disease or end stage renal disease: Secondary | ICD-10-CM | POA: Insufficient documentation

## 2020-01-08 DIAGNOSIS — F17200 Nicotine dependence, unspecified, uncomplicated: Secondary | ICD-10-CM | POA: Insufficient documentation

## 2020-01-08 DIAGNOSIS — U071 COVID-19: Secondary | ICD-10-CM | POA: Diagnosis not present

## 2020-01-08 DIAGNOSIS — R0602 Shortness of breath: Secondary | ICD-10-CM | POA: Diagnosis present

## 2020-01-08 LAB — I-STAT CHEM 8, ED
BUN: 49 mg/dL — ABNORMAL HIGH (ref 6–20)
Calcium, Ion: 0.68 mmol/L — CL (ref 1.15–1.40)
Chloride: 102 mmol/L (ref 98–111)
Creatinine, Ser: >18 mg/dL — ABNORMAL HIGH (ref 0.61–1.24)
Glucose, Bld: 87 mg/dL (ref 70–99)
HCT: 35 % — ABNORMAL LOW (ref 39.0–52.0)
Hemoglobin: 11.9 g/dL — ABNORMAL LOW (ref 13.0–17.0)
Potassium: 4.5 mmol/L (ref 3.5–5.1)
Sodium: 135 mmol/L (ref 135–145)
TCO2: 23 mmol/L (ref 22–32)

## 2020-01-08 LAB — CBC WITH DIFFERENTIAL/PLATELET
Abs Immature Granulocytes: 0.06 10*3/uL (ref 0.00–0.07)
Basophils Absolute: 0 10*3/uL (ref 0.0–0.1)
Basophils Relative: 0 %
Eosinophils Absolute: 0 10*3/uL (ref 0.0–0.5)
Eosinophils Relative: 0 %
HCT: 36.4 % — ABNORMAL LOW (ref 39.0–52.0)
Hemoglobin: 11.8 g/dL — ABNORMAL LOW (ref 13.0–17.0)
Immature Granulocytes: 1 %
Lymphocytes Relative: 7 %
Lymphs Abs: 0.5 10*3/uL — ABNORMAL LOW (ref 0.7–4.0)
MCH: 35.8 pg — ABNORMAL HIGH (ref 26.0–34.0)
MCHC: 32.4 g/dL (ref 30.0–36.0)
MCV: 110.3 fL — ABNORMAL HIGH (ref 80.0–100.0)
Monocytes Absolute: 0.3 10*3/uL (ref 0.1–1.0)
Monocytes Relative: 4 %
Neutro Abs: 6.2 10*3/uL (ref 1.7–7.7)
Neutrophils Relative %: 88 %
Platelets: 125 10*3/uL — ABNORMAL LOW (ref 150–400)
RBC: 3.3 MIL/uL — ABNORMAL LOW (ref 4.22–5.81)
RDW: 15.3 % (ref 11.5–15.5)
WBC: 7.1 10*3/uL (ref 4.0–10.5)
nRBC: 0 % (ref 0.0–0.2)

## 2020-01-08 LAB — COMPREHENSIVE METABOLIC PANEL
ALT: 27 U/L (ref 0–44)
AST: 43 U/L — ABNORMAL HIGH (ref 15–41)
Albumin: 2.8 g/dL — ABNORMAL LOW (ref 3.5–5.0)
Alkaline Phosphatase: 31 U/L — ABNORMAL LOW (ref 38–126)
Anion gap: 22 — ABNORMAL HIGH (ref 5–15)
BUN: 53 mg/dL — ABNORMAL HIGH (ref 6–20)
CO2: 20 mmol/L — ABNORMAL LOW (ref 22–32)
Calcium: 7 mg/dL — ABNORMAL LOW (ref 8.9–10.3)
Chloride: 98 mmol/L (ref 98–111)
Creatinine, Ser: 16.82 mg/dL — ABNORMAL HIGH (ref 0.61–1.24)
GFR, Estimated: 3 mL/min — ABNORMAL LOW (ref >60)
Glucose, Bld: 85 mg/dL (ref 70–99)
Potassium: 4.7 mmol/L (ref 3.5–5.1)
Sodium: 140 mmol/L (ref 135–145)
Total Bilirubin: 0.8 mg/dL (ref 0.3–1.2)
Total Protein: 6.3 g/dL — ABNORMAL LOW (ref 6.5–8.1)

## 2020-01-08 LAB — LACTATE DEHYDROGENASE: LDH: 298 U/L — ABNORMAL HIGH (ref 98–192)

## 2020-01-08 LAB — FIBRINOGEN: Fibrinogen: 541 mg/dL — ABNORMAL HIGH (ref 210–475)

## 2020-01-08 LAB — D-DIMER, QUANTITATIVE: D-Dimer, Quant: 0.5 ug/mL-FEU (ref 0.00–0.50)

## 2020-01-08 LAB — PROCALCITONIN: Procalcitonin: 0.7 ng/mL

## 2020-01-08 LAB — LACTIC ACID, PLASMA: Lactic Acid, Venous: 1.5 mmol/L (ref 0.5–1.9)

## 2020-01-08 LAB — TRIGLYCERIDES: Triglycerides: 632 mg/dL — ABNORMAL HIGH (ref <150)

## 2020-01-08 NOTE — ED Notes (Signed)
Pt o2 sats are 94% on room air, Pt states he does not feel SOB, however when he spike a fever he was informed to come to the ED and not take any medication

## 2020-01-08 NOTE — ED Notes (Signed)
Pt is a difficult stick will seek phlebotomy  for rest of blood work and IV team for IV placement

## 2020-01-08 NOTE — ED Triage Notes (Signed)
Assume care from EMS, ems reports pt receive a positive COVID result with increase SOB, Cough and fevers. EMS states O2 sats 89% on room air and place pt on non rebreather with improve o2 SATS TO 97%. Pt denies any chest pain, however missed dialysis, last dialysis was Tuesday

## 2020-01-08 NOTE — ED Notes (Signed)
Pt tolerated ambulation well O2 sats decrease to 91% on room air, will notified MD

## 2020-01-08 NOTE — ED Provider Notes (Signed)
[facility] EMERGENCY DEPARTMENT Provider Note   CSN: [id number] Arrival date & time:        History Chief Complaint  Patient presents with  . Shortness of Breath    Earl Middleton. is a 54 y.o. male.  HPI      54 year old male with history of ESRD on dialysis Tuesday Thursday Saturday, type 2 diabetes, hypertension, hyperlipidemia presents with concern for being found positive with COVID-19 and fever. Reports he initially began to have symptoms Tuesday, tested positive today for Covid. Denies any shortness of breath from his baseline, or cough from his baseline. Per EMS sats 89% on room air  Tuesday dialysis had chills, felt cold Fever Tuesday night Cough for months, coughing up phlegm No nausea or vomiting Has had diarrhea Low appetite Loss of taste and smell Missed dialysis today Fatigue, and tested positive today No chest pain Has not been vaccinated   Past Medical History:  Diagnosis Date  . Arthritis   . Degenerative disc disease, lumbar   . Diabetes ([place])    type 2  . GERD (gastroesophageal reflux disease)   . Heart disease   . Heartburn   . Hemorrhoid   . HLD (hyperlipidemia)   . HTN (hypertension)   . Hydrocele   . Sleep apnea     Patient Active Problem List   Diagnosis Date Noted  . SIRS (systemic inflammatory response syndrome) ([place]) 09/12/2018  . ESRD (end stage renal disease) ([place]) 09/12/2018  . Essential hypertension 09/12/2018  . DM (diabetes mellitus), type 2 with renal complications ([place]) 29/51/8841  . Anemia associated with chronic renal failure 09/12/2018  . Other long term (current) drug therapy 09/18/2012  . Abnormal presence of protein in urine 08/27/2012    Past Surgical History:  Procedure Laterality Date  . CARPAL TUNNEL RELEASE  2006  . COLONOSCOPY  1992  . CORONARY ANGIOPLASTY WITH STENT PLACEMENT  2005  . POLYPECTOMY  1993   throat  . ROTATOR CUFF REPAIR  2004       Family History  Problem  Relation Age of Onset  . Kidney disease Father   . Prostate cancer Neg Hx     Social History   Tobacco Use  . Smoking status: Current Every Day Smoker    Packs/day: 0.50    Years: 1.00    Pack years: 0.50  . Smokeless tobacco: Never Used  Substance Use Topics  . Alcohol use: Yes    Alcohol/week: 0.0 standard drinks  . Drug use: No    Home Medications Prior to Admission medications   Medication Sig Start Date End Date Taking? Authorizing Provider  aspirin 81 MG chewable tablet Chew 81 mg by mouth daily.     [provider]  atenolol (TENORMIN) 100 MG tablet Take 100 mg by mouth daily. Reported on 06/01/2015    [provider]  atorvastatin (LIPITOR) 80 MG tablet Take 80 mg by mouth daily.     [provider]  clopidogrel (PLAVIX) 75 MG tablet Take 75 mg by mouth daily.     [provider]  escitalopram (LEXAPRO) 20 MG tablet Take 20 mg by mouth.    [provider]  fenofibrate 160 MG tablet Take 160 mg by mouth daily. Reported on 06/01/2015    [provider]  furosemide (LASIX) 20 MG tablet Take 40-80 mg by mouth 2 (two) times daily. 80 mg in the morning and 40 mg in the afternoon    [provider]  glipiZIDE (GLUCOTROL XL) 5 MG 24 hr tablet Take 5 mg by mouth daily. Reported on 06/01/2015 11/21/14   [provider]  hydrALAZINE (APRESOLINE) 50 MG tablet Take 50 mg by mouth 3 (three) times daily.     [provider]  ibuprofen (ADVIL,MOTRIN) 800 MG tablet Take 800 mg by mouth every 6 (six) hours as needed for moderate pain.  04/12/15   [provider]  Multiple Vitamin (MULTI-VITAMINS) TABS Take 1 tablet by mouth daily.     [provider]  NIFEdipine (ADALAT CC) 90 MG 24 hr tablet Take 90 mg by mouth daily.     [provider]  Omega-3 1000 MG CAPS Take 1 capsule by mouth 3 (three) times daily.     [provider]  spironolactone (ALDACTONE) 25 MG tablet Take 25 mg by  mouth daily.  05/05/15   [provider]  vitamin B-12 (CYANOCOBALAMIN) 500 MCG tablet Take 500 mcg by mouth daily. Reported on 06/01/2015    [provider]    Allergies    Patient has no known allergies.  Review of Systems   Review of Systems  Constitutional: Positive for activity change, appetite change, fatigue and fever.  HENT: Negative for sore throat.   Eyes: Negative for visual disturbance.  Respiratory: Positive for cough (chronic) and shortness of breath (not changed from baseline).   Cardiovascular: Negative for chest pain.  Gastrointestinal: Positive for diarrhea. Negative for abdominal pain, nausea and vomiting.  Genitourinary: Negative for difficulty urinating.  Musculoskeletal: Negative for back pain and neck stiffness.  Skin: Negative for rash.  Neurological: Negative for syncope and headaches.    Physical Exam Updated Vital Signs BP (!) 104/58   Pulse 90   Temp (!) 102.8 F (39.3 C) (Oral)   Resp 16   Ht 6' (1.829 m)   Wt (!) 145.2 kg   SpO2 91%   BMI 43.40 kg/m   Physical Exam Vitals and nursing note reviewed.  Constitutional:      General: He is not in acute distress.    Appearance: He is well-developed. He is not diaphoretic.  HENT:     Head: Normocephalic and atraumatic.  Eyes:     Conjunctiva/sclera: Conjunctivae normal.  Cardiovascular:     Rate and Rhythm: Normal rate and regular rhythm.     Heart sounds: Normal heart sounds. No murmur heard.  No friction rub. No gallop.   Pulmonary:     Effort: Pulmonary effort is normal. No respiratory distress.     Breath sounds: Normal breath sounds. No wheezing or rales.  Abdominal:     General: There is no distension.     Palpations: Abdomen is soft.     Tenderness: There is no abdominal tenderness. There is no guarding.  Musculoskeletal:     Cervical back: Normal range of motion.  Skin:    General: Skin is warm and dry.  Neurological:     Mental Status: He is alert and oriented to  person, place, and time.     ED Results / Procedures / Treatments   Labs (all labs ordered are listed, but only abnormal results are displayed) Labs Reviewed  CBC WITH DIFFERENTIAL/PLATELET - Abnormal; Notable for the following components:      Result Value   RBC 3.30 (*)    Hemoglobin 11.8 (*)    HCT 36.4 (*)    MCV 110.3 (*)    MCH 35.8 (*)    Platelets 125 (*)  Lymphs Abs 0.5 (*)    All other components within normal limits  COMPREHENSIVE METABOLIC PANEL - Abnormal; Notable for the following components:   CO2 20 (*)    BUN 53 (*)    Creatinine, Ser 16.82 (*)    Calcium 7.0 (*)    Total Protein 6.3 (*)    Albumin 2.8 (*)    AST 43 (*)    Alkaline Phosphatase 31 (*)    GFR, Estimated 3 (*)    Anion gap 22 (*)    All other components within normal limits  LACTATE DEHYDROGENASE - Abnormal; Notable for the following components:   LDH 298 (*)    All other components within normal limits  TRIGLYCERIDES - Abnormal; Notable for the following components:   Triglycerides 632 (*)    All other components within normal limits  FIBRINOGEN - Abnormal; Notable for the following components:   Fibrinogen 541 (*)    All other components within normal limits  C-REACTIVE PROTEIN - Abnormal; Notable for the following components:   CRP 21.0 (*)    All other components within normal limits  FERRITIN - Abnormal; Notable for the following components:   Ferritin 3,089 (*)    All other components within normal limits  I-STAT CHEM 8, ED - Abnormal; Notable for the following components:   BUN 49 (*)    Creatinine, Ser >18.00 (*)    Calcium, Ion 0.68 (*)    Hemoglobin 11.9 (*)    HCT 35.0 (*)    All other components within normal limits  CULTURE, BLOOD (ROUTINE X 2)  CULTURE, BLOOD (ROUTINE X 2)  LACTIC ACID, PLASMA  D-DIMER, QUANTITATIVE (NOT AT [facility])  PROCALCITONIN  LACTIC ACID, PLASMA    EKG EKG Interpretation  Date/Time:  Thursday January 08 2020 20:29:15 EDT Ventricular  Rate:  91 PR Interval:    QRS Duration: 80 QT Interval:  348 QTC Calculation: 429 R Axis:   79 Text Interpretation: Sinus rhythm Probable anteroseptal infarct, old No significant change since last tracing Confirmed by Gareth Morgan [phone]) on 01/08/2020 8:42:16 PM   Radiology DG Chest Port 1 View  Result Date: 01/08/2020 CLINICAL DATA:  Shortness of breath EXAM: PORTABLE CHEST 1 VIEW COMPARISON:  11/19/2019 FINDINGS: Cardiac shadow is within normal limits. The lungs are well aerated bilaterally. No focal infiltrate or sizable effusion is seen. No bony abnormality is noted. IMPRESSION: No acute abnormality noted. Electronically Signed   By: Inez Catalina M.D.   On: 01/08/2020 21:46    Procedures Procedures (including critical care time)  Medications Ordered in ED Medications - No data to display  ED Course  I have reviewed the triage vital signs and the nursing notes.  Pertinent labs & imaging results that were available during my care of the patient were reviewed by me and considered in my medical decision making (see chart for details).    MDM Rules/Calculators/A&P                          54 year old male with history of ESRD on dialysis Tuesday Thursday Saturday, type 2 diabetes, hypertension, hyperlipidemia presents with concern for being found positive with missing dialysis, COVID-19 and fever.  CXR without acute abnormalities. DDimer negative. Lactic acid negative.  No hyperkalemia.  Initially considered admission given comorbidities, however observed in the ED and found to have oxygen levels above 90% (94-100) including 91% with ambulation.  Do not see indication for emergent dialysis. Discussed with Nephrology  who reports COVID positive patients can receive dialysis at their normal dialysis centers and he should call to have time.  Given current clinical scenario feel most appropriate care is dialysis tomorrow with close outpatient follow up and MAB infusion.  Discussed  with patient who agrees.  Discussed strict return precautions. Patient discharged in stable condition with understanding of reasons to return.    Final Clinical Impression(s) / ED Diagnoses Final diagnoses:  COVID-19    Rx / DC Orders ED Discharge Orders    None       Gareth Morgan, MD 01/09/20 0126

## 2020-01-08 NOTE — ED Notes (Signed)
Pt ambulated in the room with steady gait. O2 was at 94% while in bed and dropped to 91% while ambulating on room air. No complaint of worsening shortness of breath while ambulating.

## 2020-01-09 LAB — FERRITIN: Ferritin: 3089 ng/mL — ABNORMAL HIGH (ref 24–336)

## 2020-01-09 LAB — C-REACTIVE PROTEIN: CRP: 21 mg/dL — ABNORMAL HIGH (ref <1.0)

## 2020-01-09 MED ORDER — ACETAMINOPHEN 500 MG PO TABS
1000.0000 mg | ORAL_TABLET | Freq: Once | ORAL | Status: AC
  Administered 2020-01-09: 1000 mg via ORAL
  Filled 2020-01-09: qty 2

## 2020-01-09 NOTE — ED Notes (Signed)
Pt is d.c and Is given d.c instructions and follow up care, Pt is waiting for [facility] to be transported home

## 2020-01-09 NOTE — Discharge Instructions (Addendum)
You have been diagnosed with COVID 19 and missed dialysis today, however we are happy to say that your oxygen levels have been stable here in the emergency department, your chest XR is clear, and we do not see need for emergent dialysis tonight.  The monoclonal antibody infusion clinic will be calling you tomorrow to schedule an infusion of monoclonal antibody for treatment of your COVID 19 infection. Please keep your phone near you to receive their call.  Take tylenol for fevers and body aches.    Contact your dialysis center in the AM regarding receiving dialysis-they should schedule you for a time for COVID positive dialysis.

## 2020-01-09 NOTE — ED Notes (Signed)
Called [facility] 

## 2020-01-14 LAB — CULTURE, BLOOD (ROUTINE X 2): Culture: NO GROWTH

## 2020-01-26 DEATH — deceased

## 2020-07-24 IMAGING — CR CHEST - 2 VIEW
2 series · 2 of 2 positions shown · non-contrast
Comparison: None.

CLINICAL DATA: Suspected sepsis. Fever for 2 days.

EXAM:
CHEST - 2 VIEW

[chest pa]
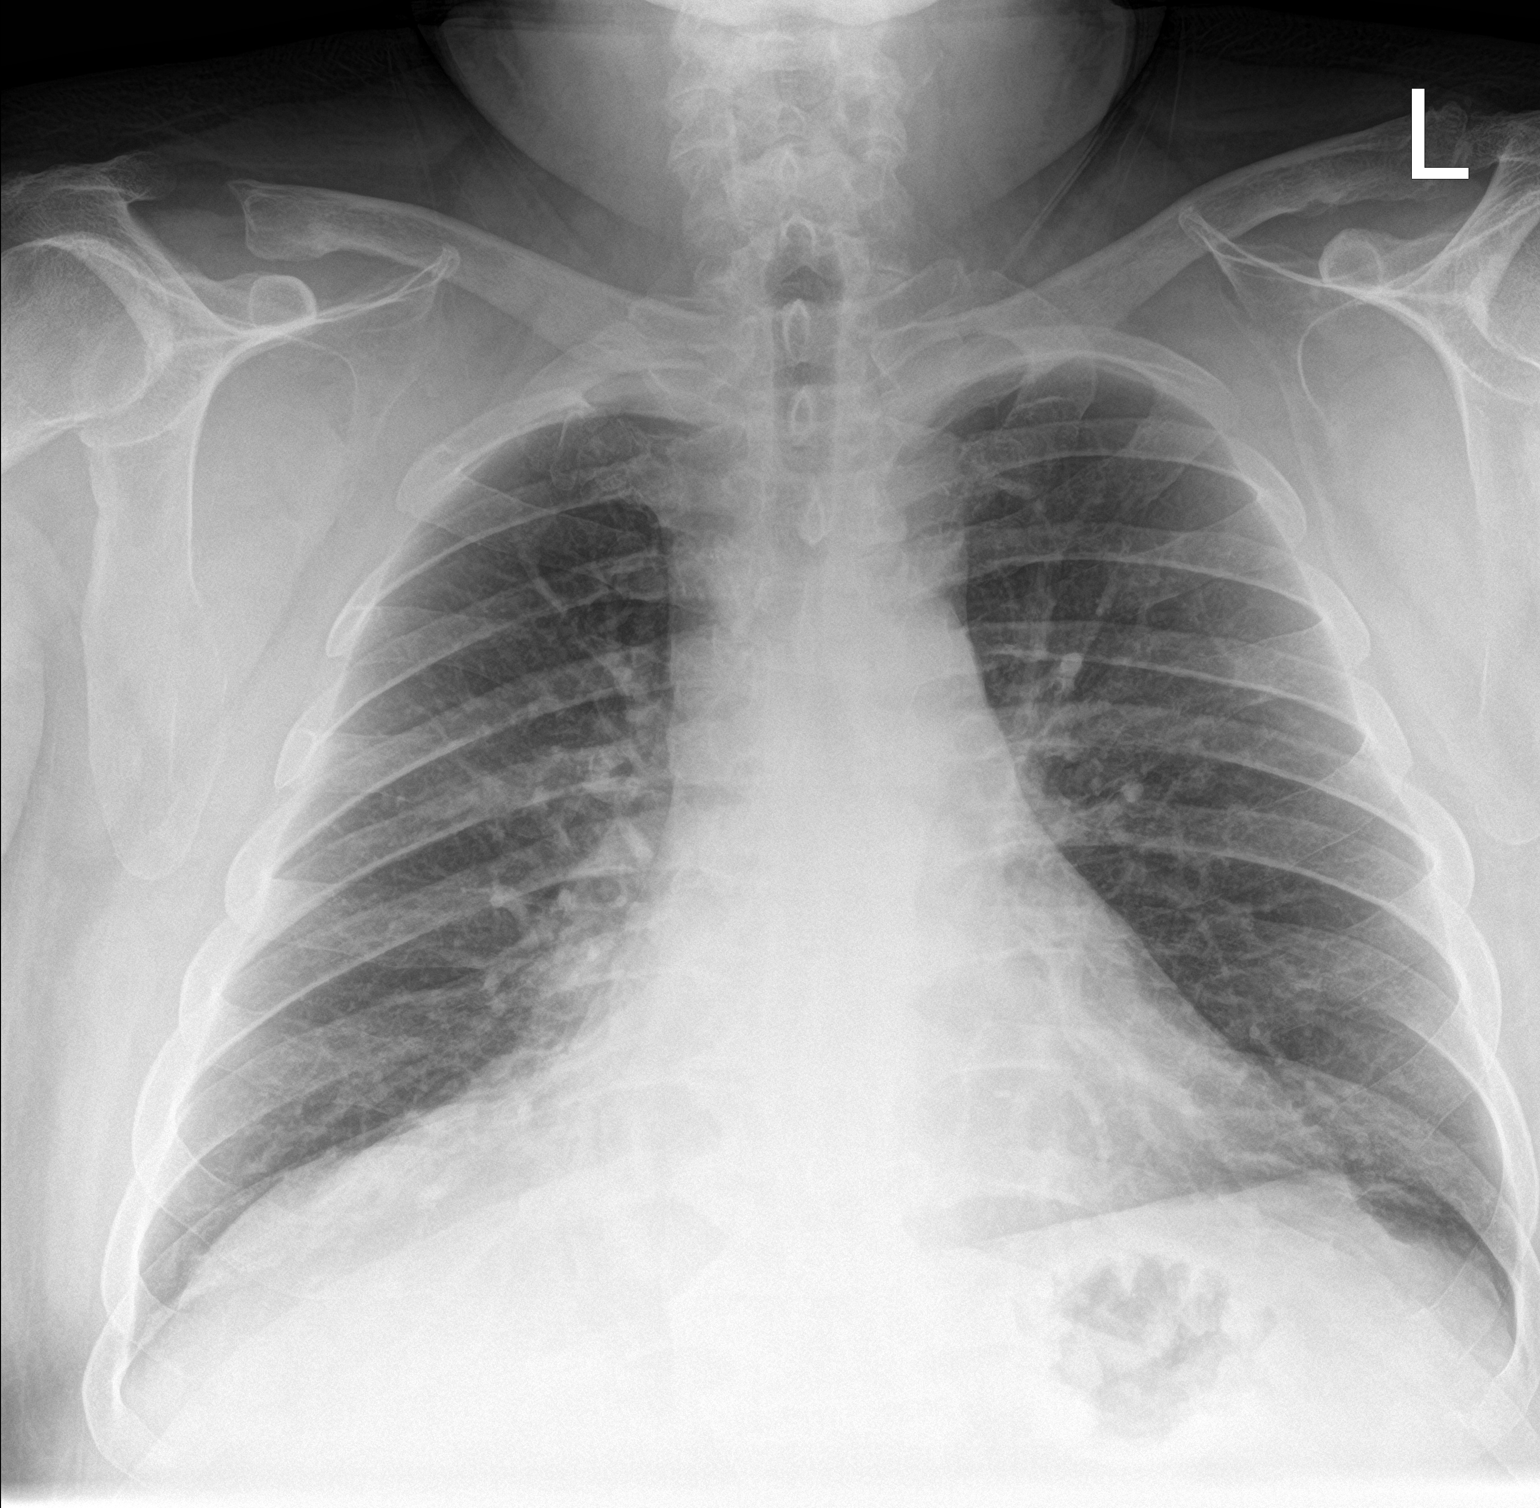

[chest lat]
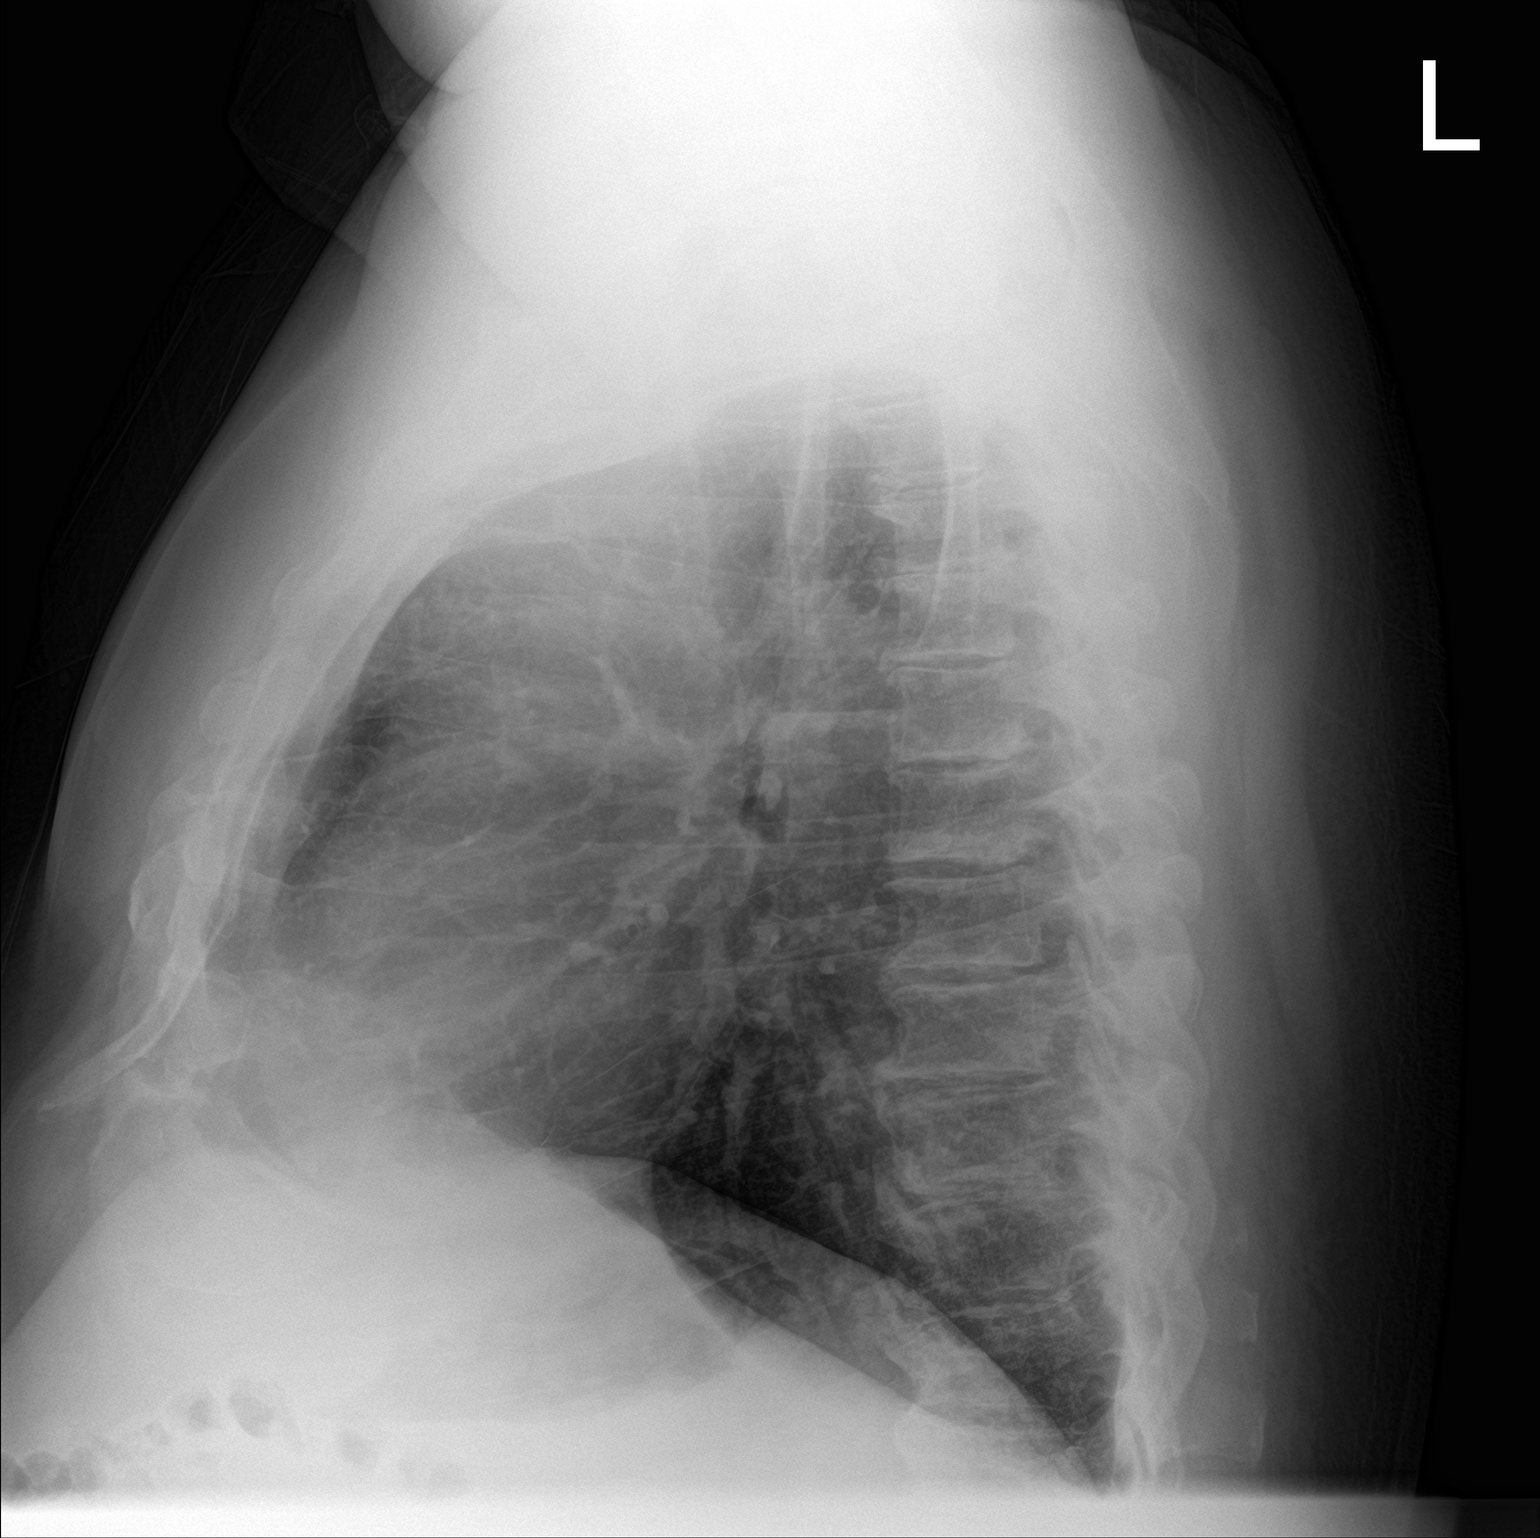

[2 of 2 positions shown; findings below may reference images not displayed]

FINDINGS: Upper normal heart size with normal mediastinal contours. Mild
hyperinflation and bronchial thickening. Pulmonary vasculature is
normal. No confluent consolidation, pleural effusion, or
pneumothorax. Multilevel degenerative change throughout spine.
Widening of the right acromioclavicular joint is likely
postsurgical. No acute osseous abnormalities are seen.
IMPRESSION: Mild hyperinflation and bronchial thickening suggesting bronchitis,
asthma, or smoking related lung disease. No consolidation to suggest
pneumonia.

## 2020-07-25 IMAGING — CT CT CHEST WITHOUT CONTRAST
2 of 3 series · 15 of 36 positions shown, 18 images · non-contrast
Comparison: 09/11/2018

CLINICAL DATA: Fever, chills, cough

EXAM:
CT CHEST WITHOUT CONTRAST
TECHNIQUE: Multidetector CT imaging of the chest was performed following the
standard protocol without IV contrast.

[Series 3: chest w/o 2mm st · axial · non-contrast · 0.98mm/px · z∈[-207,+115]mm · 12 of 191 slices shown, 15 images]
[im 15/191  mediastinal]
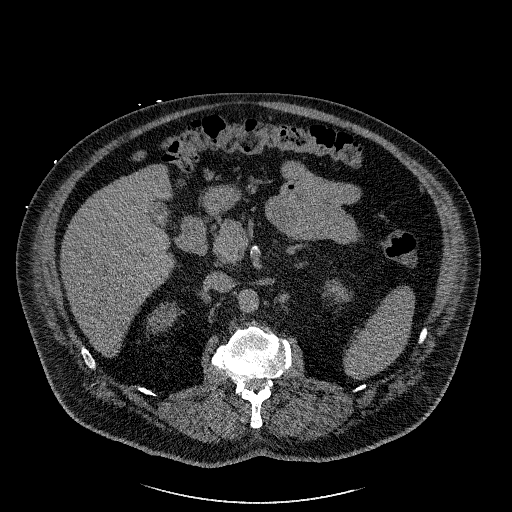
[im 15/191  lung]
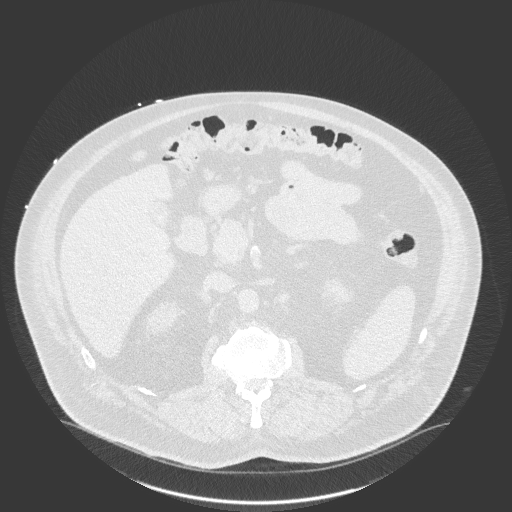
[im 29/191  lung]
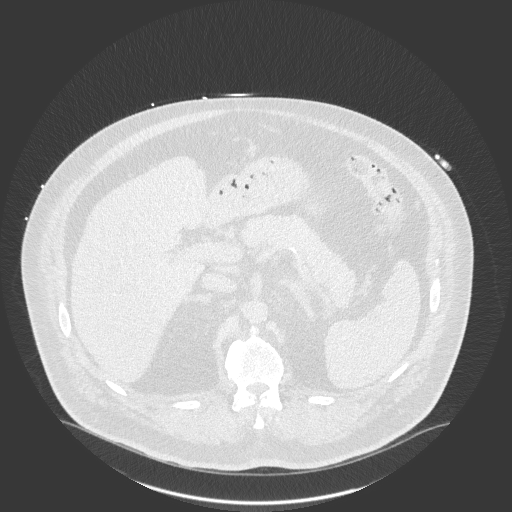
[im 43/191  lung]
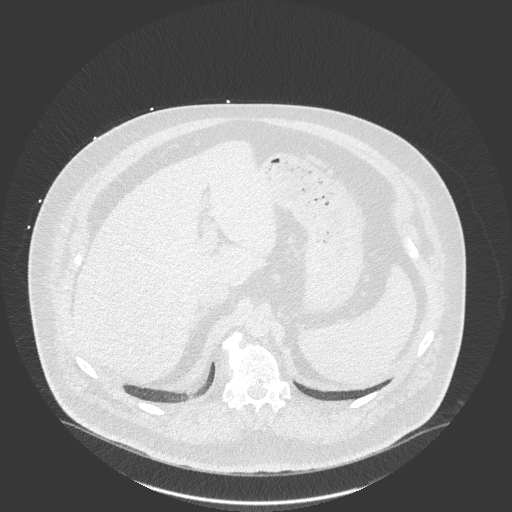
[im 57/191  lung]
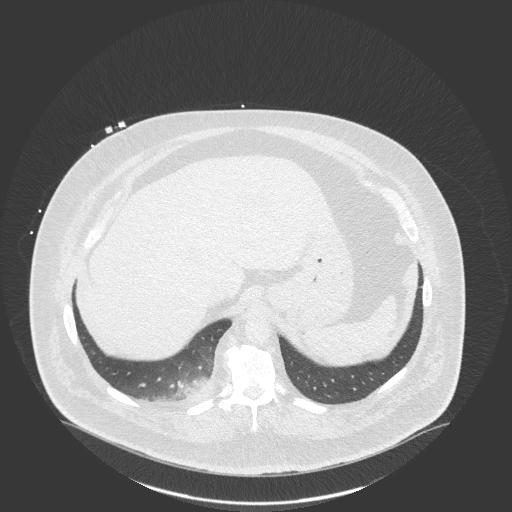
[im 71/191  mediastinal]
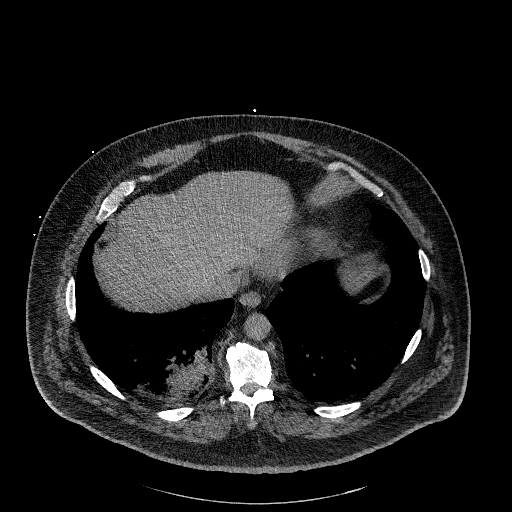
[im 71/191  lung]
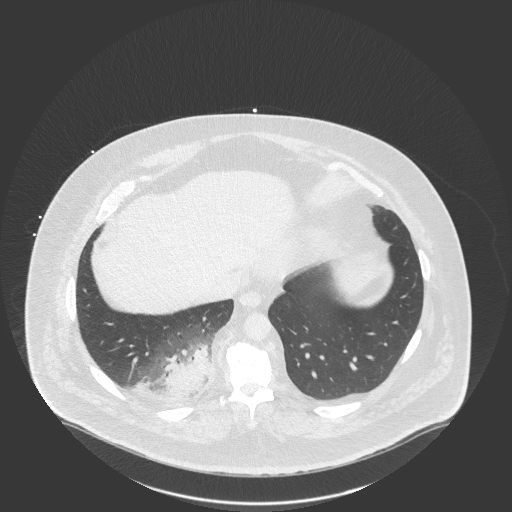
[im 85/191  lung]
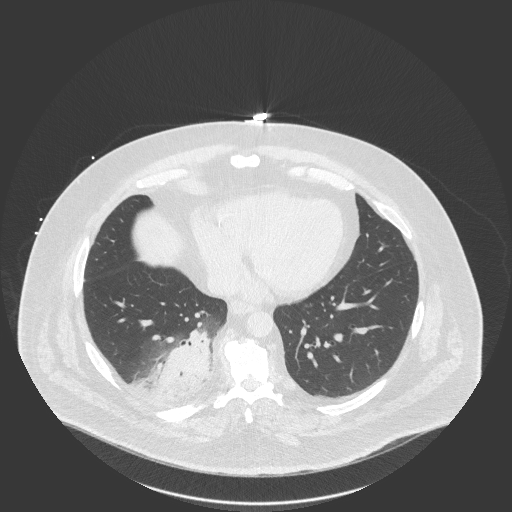
[im 106/191  lung]
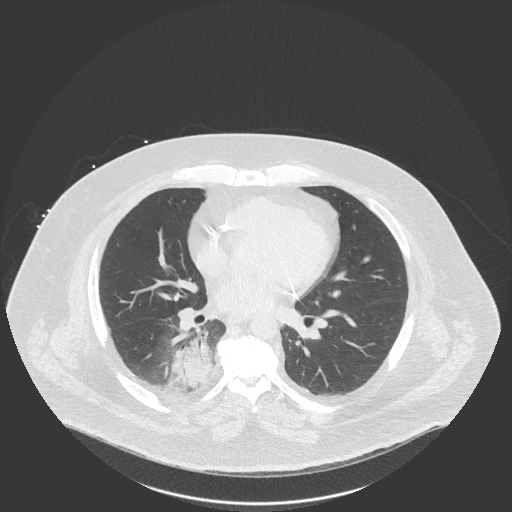
[im 120/191  lung]
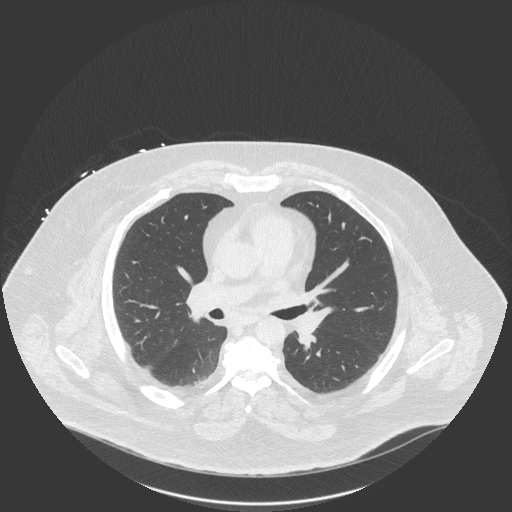
[im 134/191  mediastinal]
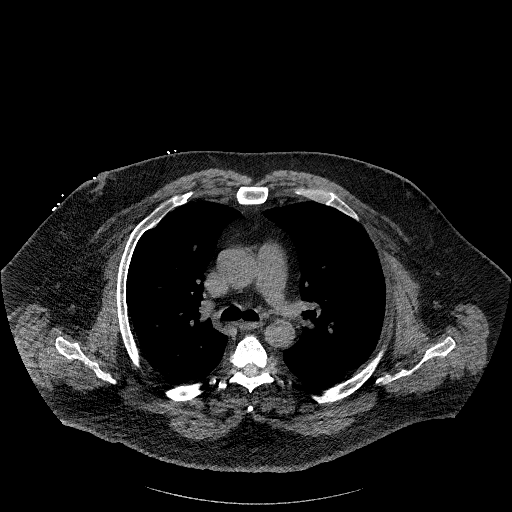
[im 134/191  lung]
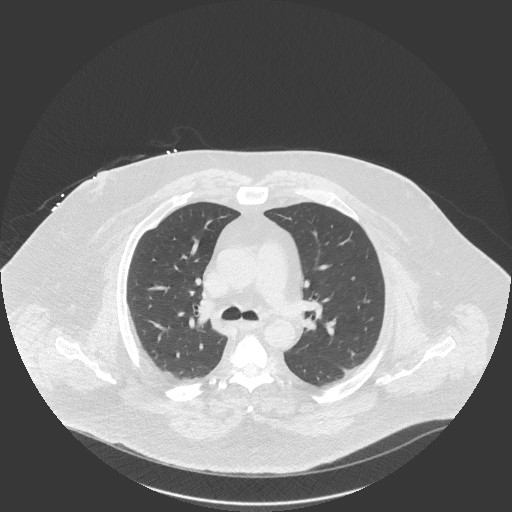
[im 148/191  lung]
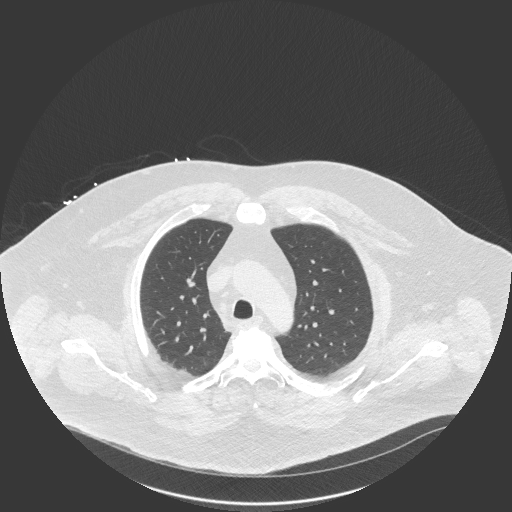
[im 162/191  lung]
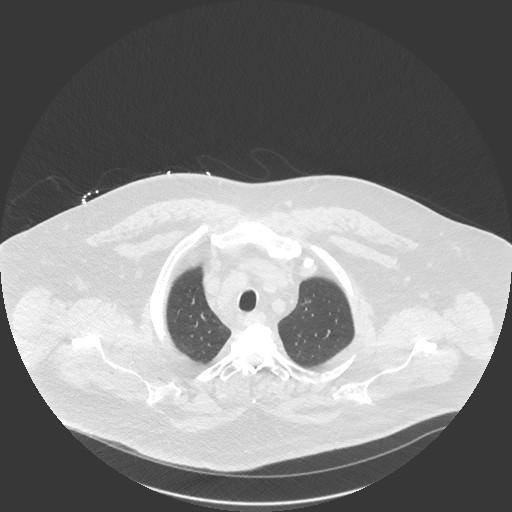
[im 176/191  lung]
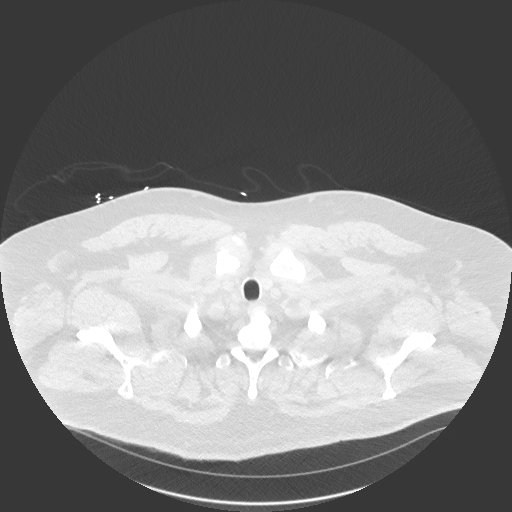

[Series 6: chest w/o 3mm st cor · coronal · non-contrast · 0.74mm/px · 3 of 130 slices shown]
[im 26/130  lung]
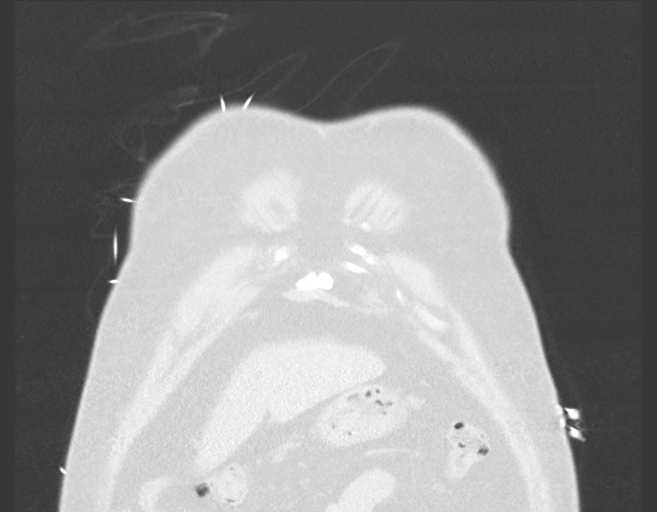
[im 52/130  lung]
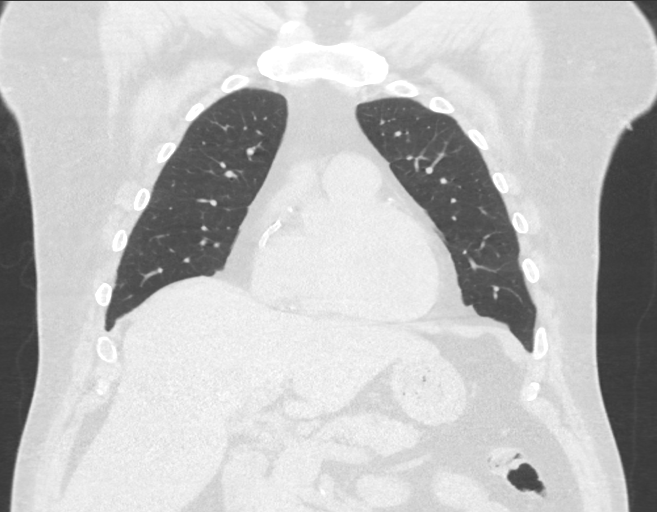
[im 78/130  lung]
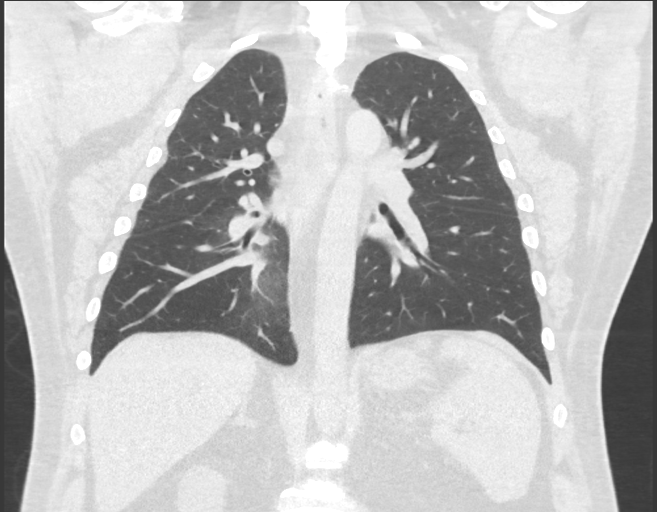

[15 of 36 positions shown; findings below may reference images not displayed]

FINDINGS: Cardiovascular: Heart is normal size. Coronary artery calcifications
diffusely. Scattered aortic calcifications. No aneurysm.

Mediastinum/Nodes: No mediastinal, hilar, or axillary adenopathy.

Lungs/Pleura: Consolidation noted posteriorly in the right lower
lobe compatible with pneumonia. No effusions. Left lung clear.

Upper Abdomen: Imaging into the upper abdomen shows no acute
findings.

Musculoskeletal: Chest wall soft tissues are unremarkable. No acute
bony abnormality.
IMPRESSION: Airspace opacity posteriorly in the right lower lobe with air
bronchograms most compatible with pneumonia.

Diffuse coronary artery disease.

Aortic Atherosclerosis ([id number]).

## 2021-11-20 IMAGING — DX DG CHEST 1V PORT
1 series · 1 of 1 positions shown · non-contrast
Comparison: 11/19/2019

CLINICAL DATA: Shortness of breath

EXAM:
PORTABLE CHEST 1 VIEW

[chest ap]
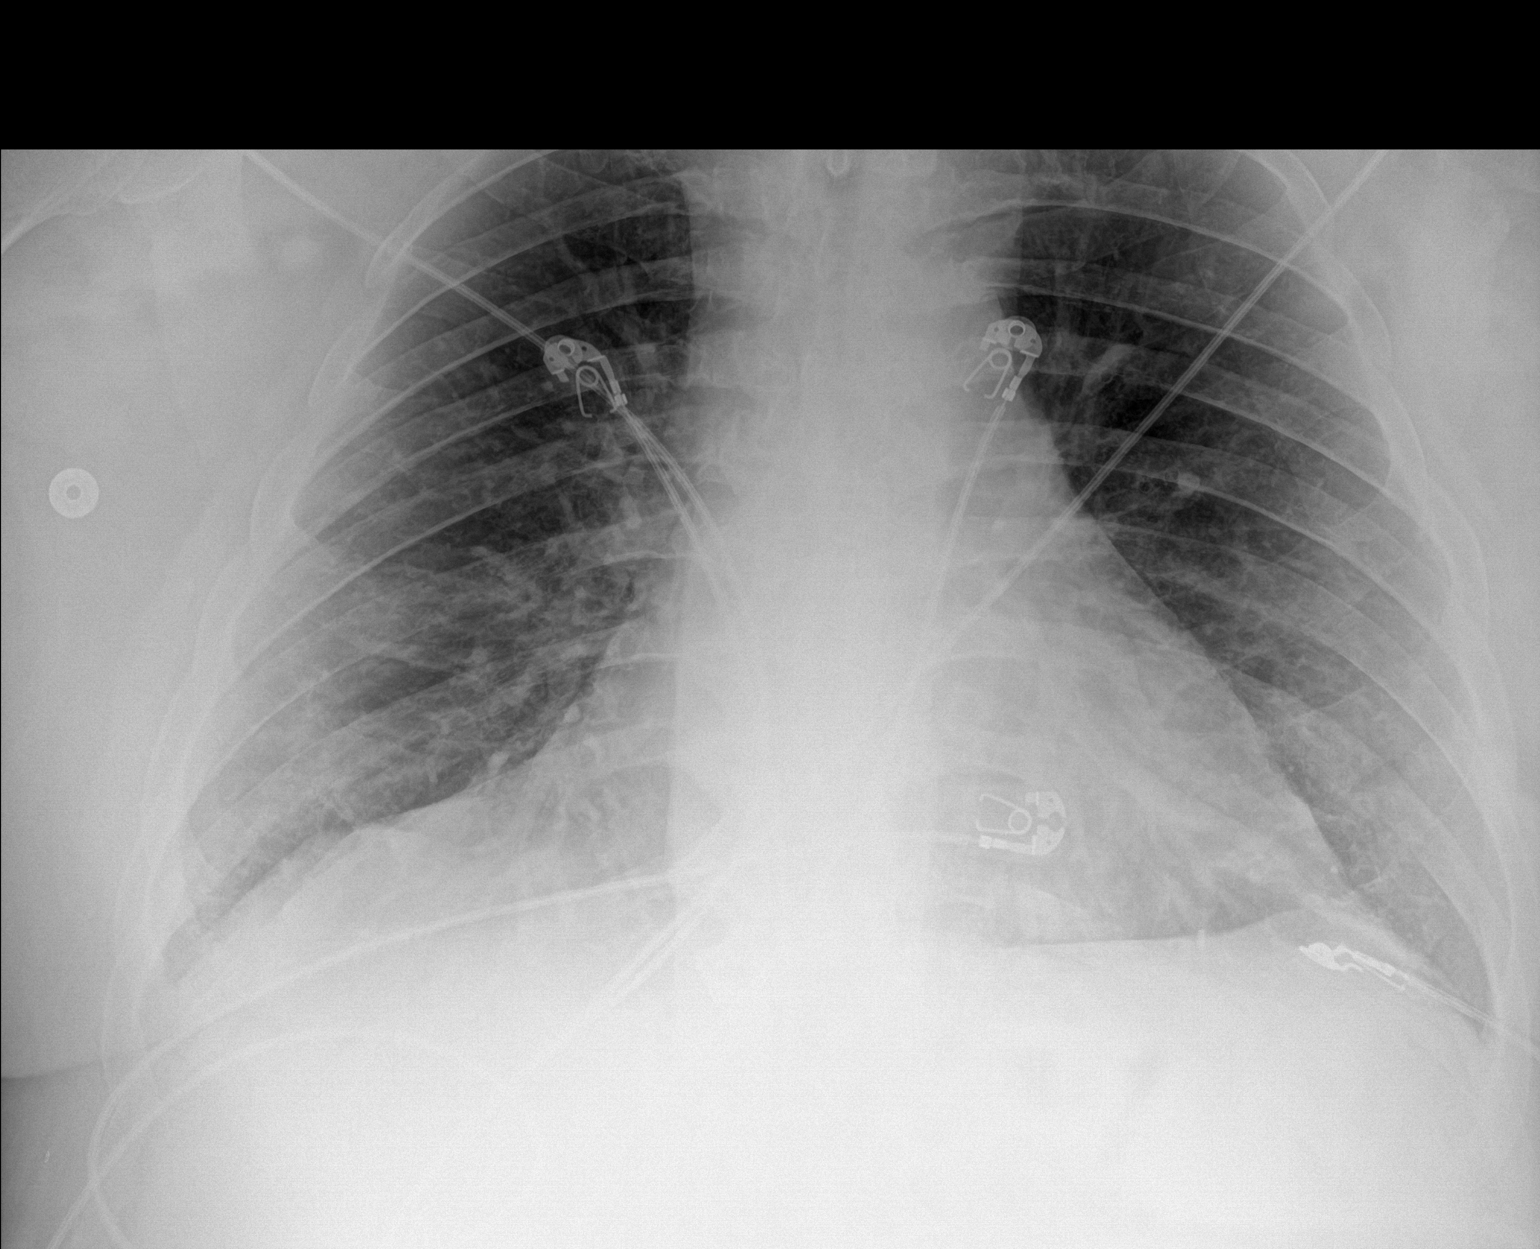

[1 of 1 positions shown; findings below may reference images not displayed]

FINDINGS: Cardiac shadow is within normal limits. The lungs are well aerated
bilaterally. No focal infiltrate or sizable effusion is seen. No
bony abnormality is noted.
IMPRESSION: No acute abnormality noted.
